# Patient Record
Sex: Male | Born: 1955 | Race: White | Hispanic: No | Marital: Married | Smoking: Never smoker
Health system: Southern US, Community
[De-identification: ages and names within clinical notes are randomized; demographics above are authoritative.]

## PROBLEM LIST (undated history)

## (undated) DIAGNOSIS — E785 Hyperlipidemia, unspecified: Secondary | ICD-10-CM

## (undated) DIAGNOSIS — J45909 Unspecified asthma, uncomplicated: Secondary | ICD-10-CM

## (undated) DIAGNOSIS — T7840XA Allergy, unspecified, initial encounter: Secondary | ICD-10-CM

## (undated) DIAGNOSIS — N400 Enlarged prostate without lower urinary tract symptoms: Secondary | ICD-10-CM

## (undated) DIAGNOSIS — G5600 Carpal tunnel syndrome, unspecified upper limb: Secondary | ICD-10-CM

## (undated) DIAGNOSIS — M1990 Unspecified osteoarthritis, unspecified site: Secondary | ICD-10-CM

## (undated) HISTORY — PX: EYE SURGERY: SHX253

## (undated) HISTORY — PX: CARPAL TUNNEL RELEASE: SHX101

## (undated) HISTORY — PX: TONSILLECTOMY: SUR1361

## (undated) HISTORY — PX: NASAL SINUS SURGERY: SHX719

## (undated) HISTORY — PX: COLONOSCOPY: SHX174

## (undated) HISTORY — PX: REFRACTIVE SURGERY: SHX103

---
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
## 2005-01-09 ENCOUNTER — Ambulatory Visit: Payer: Self-pay | Admitting: Internal Medicine

## 2005-01-13 ENCOUNTER — Ambulatory Visit: Payer: Self-pay | Admitting: Internal Medicine

## 2007-09-25 ENCOUNTER — Ambulatory Visit: Payer: Self-pay | Admitting: Emergency Medicine

## 2010-01-17 ENCOUNTER — Ambulatory Visit: Payer: Self-pay | Admitting: Unknown Physician Specialty

## 2012-02-01 ENCOUNTER — Ambulatory Visit: Payer: Self-pay | Admitting: Unknown Physician Specialty

## 2016-01-02 DIAGNOSIS — F325 Major depressive disorder, single episode, in full remission: Secondary | ICD-10-CM | POA: Insufficient documentation

## 2017-11-02 DIAGNOSIS — I10 Essential (primary) hypertension: Secondary | ICD-10-CM

## 2017-11-02 HISTORY — DX: Essential (primary) hypertension: I10

## 2019-10-13 ENCOUNTER — Other Ambulatory Visit: Payer: Self-pay

## 2019-10-13 DIAGNOSIS — Z20822 Contact with and (suspected) exposure to covid-19: Secondary | ICD-10-CM

## 2019-10-14 LAB — NOVEL CORONAVIRUS, NAA: SARS-CoV-2, NAA: NOT DETECTED

## 2020-08-05 DIAGNOSIS — N401 Enlarged prostate with lower urinary tract symptoms: Secondary | ICD-10-CM | POA: Insufficient documentation

## 2020-08-05 DIAGNOSIS — I10 Essential (primary) hypertension: Secondary | ICD-10-CM | POA: Insufficient documentation

## 2020-08-05 DIAGNOSIS — R351 Nocturia: Secondary | ICD-10-CM | POA: Insufficient documentation

## 2020-11-15 ENCOUNTER — Other Ambulatory Visit: Payer: Self-pay | Admitting: Infectious Diseases

## 2020-11-15 DIAGNOSIS — N451 Epididymitis: Secondary | ICD-10-CM

## 2020-11-18 ENCOUNTER — Other Ambulatory Visit: Payer: Self-pay

## 2020-11-18 ENCOUNTER — Ambulatory Visit
Admission: RE | Admit: 2020-11-18 | Discharge: 2020-11-18 | Disposition: A | Discharge status: Home / Self Care | Payer: Medicare PPO | Source: Ambulatory Visit | Attending: Infectious Diseases | Admitting: Infectious Diseases

## 2020-11-18 DIAGNOSIS — N451 Epididymitis: Secondary | ICD-10-CM | POA: Insufficient documentation

## 2020-11-20 ENCOUNTER — Encounter: Payer: Self-pay | Admitting: Urology

## 2020-11-20 ENCOUNTER — Other Ambulatory Visit: Payer: Self-pay

## 2020-11-20 ENCOUNTER — Ambulatory Visit: Payer: Medicare PPO | Admitting: Urology

## 2020-11-20 VITALS — BP 149/66 | HR 77 | Ht 67.0 in | Wt 239.4 lb

## 2020-11-20 DIAGNOSIS — M1990 Unspecified osteoarthritis, unspecified site: Secondary | ICD-10-CM | POA: Insufficient documentation

## 2020-11-20 DIAGNOSIS — N451 Epididymitis: Secondary | ICD-10-CM

## 2020-11-20 DIAGNOSIS — E785 Hyperlipidemia, unspecified: Secondary | ICD-10-CM | POA: Insufficient documentation

## 2020-11-20 DIAGNOSIS — N390 Urinary tract infection, site not specified: Secondary | ICD-10-CM

## 2020-11-20 LAB — URINALYSIS, COMPLETE
Bilirubin, UA: NEGATIVE
Glucose, UA: NEGATIVE
Leukocytes,UA: NEGATIVE
Nitrite, UA: NEGATIVE
RBC, UA: NEGATIVE
Specific Gravity, UA: >1.03 — ABNORMAL HIGH (ref 1.005–1.030)
Urobilinogen, Ur: 0.2 mg/dL (ref 0.2–1.0)
pH, UA: 5 (ref 5.0–7.5)

## 2020-11-20 LAB — MICROSCOPIC EXAMINATION
Bacteria, UA: NONE SEEN
RBC, Urine: NONE SEEN /hpf (ref 0–2)

## 2020-11-20 MED ORDER — TADALAFIL 5 MG PO TABS: 5.0000 mg | ORAL_TABLET | ORAL | 6 refills | Status: DC

## 2020-11-20 NOTE — Patient Instructions (Signed)
Epididymitis  Epididymitis is swelling (inflammation) or infection of the epididymis. The epididymis is a cord-like structure that is located along the top and back part of the testicle. It collects and stores sperm from the testicle. This condition can also cause pain and swelling of the testicle and scrotum. Symptoms usually start suddenly (acute epididymitis). Sometimes epididymitis starts gradually and lasts for a while (chronic epididymitis). This type may be harder to treat. What are the causes? In men ages 20-40, this condition is usually caused by a bacterial infection or a sexually transmitted disease (STD), such as:  Gonorrhea.  Chlamydia. In men 40 and older who do not have anal sex, this condition is usually caused by bacteria from a blockage or from abnormalities in the urinary system. These can result from:  Having a tube placed into the bladder (urinary catheter).  Having an enlarged or inflamed prostate gland.  Having recently had urinary tract surgery.  Having a problem with a backward flow of urine (retrograde). In men who have a condition that weakens the body's defense system (immune system), such as HIV, this condition can be caused by:  Other bacteria, including tuberculosis and syphilis.  Viruses.  Fungi. Sometimes this condition occurs without infection. This may happen because of trauma or repetitive activities such as sports. What increases the risk? You are more likely to develop this condition if you have:  Unprotected sex with more than one partner.  Anal sex.  Recently had surgery.  A urinary catheter.  Urinary problems.  A suppressed immune system. What are the signs or symptoms? This condition usually begins suddenly with chills, fever, and pain behind the scrotum and in the testicle. Other symptoms include:  Swelling of the scrotum, testicle, or both.  Pain when ejaculating or urinating.  Pain in the back or  abdomen.  Nausea.  Itching and discharge from the penis.  A frequent need to pass urine.  Redness, increased warmth, and tenderness of the scrotum. How is this diagnosed? Your health care provider can diagnose this condition based on your symptoms and medical history. Your health care provider will also do a physical exam to ask about your symptoms and check your scrotum and testicle for swelling, pain, and redness. You may also have other tests, including:  Examination of discharge from the penis.  Urine tests for infections, such as STDs.  Ultrasound test for blood flow and inflammation. Your health care provider may test you for other STDs, including HIV. How is this treated? Treatment for this condition depends on the cause. If your condition is caused by a bacterial infection, oral antibiotic medicine may be prescribed. If the bacterial infection has spread to your blood, you may need to receive IV antibiotics. For both bacterial and nonbacterial epididymitis, you may be treated with:  Rest.  Elevation of the scrotum.  Pain medicines.  Anti-inflammatory medicines. Surgery may be needed to treat:  Bacterial epididymitis that causes pus to build up in the scrotum (abscess).  Chronic epididymitis that has not responded to other treatments. Follow these instructions at home: Medicines  Take over-the-counter and prescription medicines only as told by your health care provider.  If you were prescribed an antibiotic medicine, take it as told by your health care provider. Do not stop taking the antibiotic even if your condition improves. Sexual activity  If your epididymitis was caused by an STD, avoid sexual activity until your treatment is complete.  Inform your sexual partner or partners if you test positive for   an STD. They may need to be treated. Do not engage in sexual activity with your partner or partners until their treatment is completed. Managing pain and  swelling  If directed, elevate your scrotum and apply ice. ? Put ice in a plastic bag. ? Place a small towel or pillow between your legs. ? Rest your scrotum on the pillow or towel. ? Place another towel between your skin and the plastic bag. ? Leave the ice on for 20 minutes, 2-3 times a day.  Try taking a sitz bath to help with discomfort. This is a warm water bath that is taken while you are sitting down. The water should only come up to your hips and should cover your buttocks. Do this 3-4 times per day or as told by your health care provider.  Keep your scrotum elevated and supported while resting. Ask your health care provider if you should wear a scrotal support, such as a jockstrap. Wear it as told by your health care provider.   General instructions  Return to your normal activities as told by your health care provider. Ask your health care provider what activities are safe for you.  Drink enough fluid to keep your urine pale yellow.  Keep all follow-up visits as told by your health care provider. This is important. Contact a health care provider if:  You have a fever.  Your pain medicine is not helping.  Your pain is getting worse.  Your symptoms do not improve within 3 days. Summary  Epididymitis is swelling (inflammation) or infection of the epididymis. This condition can also cause pain and swelling of the testicle and scrotum.  Treatment for this condition depends on the cause. If your condition is caused by a bacterial infection, oral antibiotic medicine may be prescribed.  Inform your sexual partner or partners if you test positive for an STD. They may need to be treated. Do not engage in sexual activity with your partner or partners until their treatment is completed.  Contact a health care provider if your symptoms do not improve within 3 days. This information is not intended to replace advice given to you by your health care provider. Make sure you discuss any  questions you have with your health care provider. Document Revised: 08/22/2018 Document Reviewed: 08/23/2018 Elsevier Patient Education  2021 Elsevier Inc.   Cystoscopy Cystoscopy is a procedure that is used to help diagnose and sometimes treat conditions that affect the lower urinary tract. The lower urinary tract includes the bladder and the urethra. The urethra is the tube that drains urine from the bladder. Cystoscopy is done using a thin, tube-shaped instrument with a light and camera at the end (cystoscope). The cystoscope may be hard or flexible, depending on the goal of the procedure. The cystoscope is inserted through the urethra, into the bladder. Cystoscopy may be recommended if you have:  Urinary tract infections that keep coming back.  Blood in the urine (hematuria).  An inability to control when you urinate (urinary incontinence) or an overactive bladder.  Unusual cells found in a urine sample.  A blockage in the urethra, such as a urinary stone.  Painful urination.  An abnormality in the bladder found during an intravenous pyelogram (IVP) or CT scan. Cystoscopy may also be done to remove a sample of tissue to be examined under a microscope (biopsy). Tell a health care provider about:  Any allergies you have.  All medicines you are taking, including vitamins, herbs, eye drops, creams, and  over-the-counter medicines.  Any problems you or family members have had with anesthetic medicines.  Any blood disorders you have.  Any surgeries you have had.  Any medical conditions you have.  Whether you are pregnant or may be pregnant. What are the risks? Generally, this is a safe procedure. However, problems may occur, including:  Infection.  Bleeding.  Allergic reactions to medicines.  Damage to other structures or organs. What happens before the procedure? Medicines Ask your health care provider about:  Changing or stopping your regular medicines. This is  especially important if you are taking diabetes medicines or blood thinners.  Taking medicines such as aspirin and ibuprofen. These medicines can thin your blood. Do not take these medicines unless your health care provider tells you to take them.  Taking over-the-counter medicines, vitamins, herbs, and supplements. Tests You may have an exam or testing, such as:  X-rays of the bladder, urethra, or kidneys.  CT scan of the abdomen or pelvis.  Urine tests to check for signs of infection. General instructions  Follow instructions from your health care provider about eating or drinking restrictions.  Ask your health care provider what steps will be taken to help prevent infection. These steps may include: ? Washing skin with a germ-killing soap. ? Taking antibiotic medicine.  Plan to have a responsible adult take you home from the hospital or clinic. What happens during the procedure?  You will be given one or more of the following: ? A medicine to help you relax (sedative). ? A medicine to numb the area (local anesthetic).  The area around the opening of your urethra will be cleaned.  The cystoscope will be passed through your urethra into your bladder.  Germ-free (sterile) fluid will flow through the cystoscope to fill your bladder. The fluid will stretch your bladder so that your health care provider can clearly examine your bladder walls.  Your doctor will look at the urethra and bladder. Your doctor may take a biopsy or remove stones.  The cystoscope will be removed, and your bladder will be emptied. The procedure may vary among health care providers and hospitals.   What can I expect after the procedure? After the procedure, it is common to have:  Some soreness or pain in your abdomen and urethra.  Urinary symptoms. These include: ? Mild pain or burning when you urinate. Pain should stop within a few minutes after you urinate. This may last for up to 1 week. ? A small  amount of blood in your urine for several days. ? Feeling like you need to urinate but producing only a small amount of urine. Follow these instructions at home: Medicines  Take over-the-counter and prescription medicines only as told by your health care provider.  If you were prescribed an antibiotic medicine, take it as told by your health care provider. Do not stop taking the antibiotic even if you start to feel better. General instructions  Return to your normal activities as told by your health care provider. Ask your health care provider what activities are safe for you.  If you were given a sedative during the procedure, it can affect you for several hours. Do not drive or operate machinery until your health care provider says that it is safe.  Watch for any blood in your urine. If the amount of blood in your urine increases, call your health care provider.    Follow instructions from your health care provider about eating or drinking restrictions.  If  a tissue sample was removed for testing (biopsy) during your procedure, it is up to you to get your test results. Ask your health care provider, or the department that is doing the test, when your results will be ready.  Drink enough fluid to keep your urine pale yellow.  Keep all follow-up visits. This is important. Contact a health care provider if:  You have pain that gets worse or does not get better with medicine, especially pain when you urinate.  You have trouble urinating.  You have more blood in your urine. Get help right away if:  You have blood clots in your urine.  You have abdominal pain.  You have a fever or chills.  You are unable to urinate. Summary  Cystoscopy is a procedure that is used to help diagnose and sometimes treat conditions that affect the lower urinary tract.  Cystoscopy is done using a thin, tube-shaped instrument with a light and camera at the end.  After the procedure, it is common  to have some soreness or pain in your abdomen and urethra.  Watch for any blood in your urine. If the amount of blood in your urine increases, call your health care provider.  If you were prescribed an antibiotic medicine, take it as told by your health care provider. Do not stop taking the antibiotic even if you start to feel better. This information is not intended to replace advice given to you by your health care provider. Make sure you discuss any questions you have with your health care provider. Document Revised: 05/31/2020 Document Reviewed: 05/31/2020 Elsevier Patient Education  2021 Elsevier Inc.  Tadalafil tablets (Cialis) What is this medicine? TADALAFIL (tah DA la fil) is used to treat erection problems in men. It is also used for enlargement of the prostate gland in men, a condition called benign prostatic hyperplasia or BPH. This medicine improves urine flow and reduces BPH symptoms. This medicine can also treat both erection problems and BPH when they occur together. This medicine may be used for other purposes; ask your health care provider or pharmacist if you have questions. COMMON BRAND NAME(S): Mady GemmaAdcirca, ALYQ, Cialis What should I tell my health care provider before I take this medicine? They need to know if you have any of these conditions:  bleeding disorders  eye or vision problems, including a rare inherited eye disease called retinitis pigmentosa  anatomical deformation of the penis, Peyronie's disease, or history of priapism (painful and prolonged erection)  heart disease, angina, a history of heart attack, irregular heart beats, or other heart problems  high or low blood pressure  history of blood diseases, like sickle cell anemia or leukemia  history of stomach bleeding  kidney disease  liver disease  stroke  an unusual or allergic reaction to tadalafil, other medicines, foods, dyes, or preservatives  pregnant or trying to get  pregnant  breast-feeding How should I use this medicine? Take this medicine by mouth with a glass of water. Follow the directions on the prescription label. You may take this medicine with or without meals. When this medicine is used for erection problems, your doctor may prescribe it to be taken once daily or as needed. If you are taking the medicine as needed, you may be able to have sexual activity 30 minutes after taking it and for up to 36 hours after taking it. Whether you are taking the medicine as needed or once daily, you should not take more than one dose per day. If  you are taking this medicine for symptoms of benign prostatic hyperplasia (BPH) or to treat both BPH and an erection problem, take the dose once daily at about the same time each day. Do not take your medicine more often than directed. Talk to your pediatrician regarding the use of this medicine in children. Special care may be needed. Overdosage: If you think you have taken too much of this medicine contact a poison control center or emergency room at once. NOTE: This medicine is only for you. Do not share this medicine with others. What if I miss a dose? If you are taking this medicine as needed for erection problems, this does not apply. If you miss a dose while taking this medicine once daily for an erection problem, benign prostatic hyperplasia, or both, take it as soon as you remember, but do not take more than one dose per day. What may interact with this medicine? Do not take this medicine with any of the following medications:  nitrates like amyl nitrite, isosorbide dinitrate, isosorbide mononitrate, nitroglycerin  other medicines for erectile dysfunction like avanafil, sildenafil, vardenafil  other tadalafil products (Adcirca)  riociguat This medicine may also interact with the following medications:  certain drugs for high blood pressure  certain drugs for the treatment of HIV infection or AIDS  certain  drugs used for fungal or yeast infections, like fluconazole, itraconazole, ketoconazole, and voriconazole  certain drugs used for seizures like carbamazepine, phenytoin, and phenobarbital  grapefruit juice  macrolide antibiotics like clarithromycin, erythromycin, troleandomycin  medicines for prostate problems  rifabutin, rifampin or rifapentine This list may not describe all possible interactions. Give your health care provider a list of all the medicines, herbs, non-prescription drugs, or dietary supplements you use. Also tell them if you smoke, drink alcohol, or use illegal drugs. Some items may interact with your medicine. What should I watch for while using this medicine? If you notice any changes in your vision while taking this drug, call your doctor or health care professional as soon as possible. Stop using this medicine and call your health care provider right away if you have a loss of sight in one or both eyes. Contact your doctor or health care professional right away if the erection lasts longer than 4 hours or if it becomes painful. This may be a sign of serious problem and must be treated right away to prevent permanent damage. If you experience symptoms of nausea, dizziness, chest pain or arm pain upon initiation of sexual activity after taking this medicine, you should refrain from further activity and call your doctor or health care professional as soon as possible. Do not drink alcohol to excess (examples, 5 glasses of wine or 5 shots of whiskey) when taking this medicine. When taken in excess, alcohol can increase your chances of getting a headache or getting dizzy, increasing your heart rate or lowering your blood pressure. Using this medicine does not protect you or your partner against HIV infection (the virus that causes AIDS) or other sexually transmitted diseases. What side effects may I notice from receiving this medicine? Side effects that you should report to your  doctor or health care professional as soon as possible:  allergic reactions like skin rash, itching or hives, swelling of the face, lips, or tongue  breathing problems  changes in hearing  changes in vision  chest pain  fast, irregular heartbeat  prolonged or painful erection  seizures Side effects that usually do not require medical attention (report to  your doctor or health care professional if they continue or are bothersome):  back pain  dizziness  flushing  headache  indigestion  muscle aches  nausea  stuffy or runny nose This list may not describe all possible side effects. Call your doctor for medical advice about side effects. You may report side effects to FDA at 1-800-FDA-1088. Where should I keep my medicine? Keep out of the reach of children. Store at room temperature between 15 and 30 degrees C (59 and 86 degrees F). Throw away any unused medicine after the expiration date. NOTE: This sheet is a summary. It may not cover all possible information. If you have questions about this medicine, talk to your doctor, pharmacist, or health care provider.  2021 Elsevier/Gold Standard (2014-03-09 13:15:49)

## 2020-11-20 NOTE — Progress Notes (Signed)
   11/20/20 8:50 AM   Philipp Ovens [birth year] [id number]  CC: Epididymitis, history of prostatitis, ED  HPI: I saw Mr. Burdell for evaluation of recurrent UTIs and epididymitis.  He is a 65 year old healthy male who had documented E. coli prostatitis in May and July 2021, and most recently right scrotal swelling and pain consistent with epididymitis.  Recent scrotal ultrasound was essentially benign, and his symptoms have improved.  He is on Cipro that was prescribed by his PCP, as well as Flomax.  He denies any significant urinary symptoms at baseline aside from occasional urgency.  He denies any gross hematuria.  He is a never smoker.  He has had some trouble with erections that is new since his most recent infection, and is never tried any medications for this.  He denies any family history of prostate cancer, but has a strong family history of BPH and "prostate problems."  He is married and denies any other sexual partners.  STD testing was reportedly normal with his PCP.  He reports his right-sided scrotal swelling has almost completely resolved.  PSA was normal at 0.89 in March 2021.  Family History: Family History  Problem Relation Age of Onset  . CAD Father   . Cancer Father     Social History:  does not have a smoking history on file. He has never used smokeless tobacco. He reports that he does not use drugs. No history on file for alcohol use.  Physical Exam: BP (!) 149/66 (BP Location: Left Arm, Patient Position: Sitting, Cuff Size: Large)   Pulse 77   Ht 5\' 7"  (1.702 m)   Wt 239 lb 6.4 oz (108.6 kg)   BMI 37.50 kg/m    Constitutional:  Alert and oriented, No acute distress. Cardiovascular: No clubbing, cyanosis, or edema. Respiratory: Normal respiratory effort, no increased work of breathing. GI: Abdomen is soft, nontender, nondistended, no abdominal masses GU: Circumcised phallus with patent meatus, testicles 20 cc and descended bilaterally without masses, right  testicle tender and mildly edematous consistent with resolving epididymitis  Laboratory Data: Reviewed, see HPI  Pertinent Imaging: I have personally viewed and interpreted the scrotal ultrasound showing an enlarged right epididymis most consistent with resolving epididymitis, consider repeat in 3 months.  Assessment & Plan:   65 year old male with no reported urinary symptoms and recurrent infections with culture documented E. coli prostatitis in May and June 2021, and most recently right-sided epididymitis with negative culture.  We discussed possible etiologies at length.  I recommended further evaluation with CT urogram and cystoscopy.  We also reviewed his new problems with erectile dysfunction, I recommended a trial of Cialis 5 mg every other day as his epididymitis is resolving.  Follow-up for CT urogram and cystoscopy, PVR, consider repeat scrotal ultrasound in 3 months Trial of Cialis 5 mg every other day for ED   02-14-2001, MD 11/20/2020  [facility] [address] [place], [place] [phone]

## 2020-11-22 ENCOUNTER — Other Ambulatory Visit: Payer: Self-pay

## 2020-12-04 ENCOUNTER — Other Ambulatory Visit: Payer: Self-pay

## 2020-12-04 ENCOUNTER — Ambulatory Visit
Admission: RE | Admit: 2020-12-04 | Discharge: 2020-12-04 | Disposition: A | Discharge status: Home / Self Care | Payer: Medicare PPO | Source: Ambulatory Visit | Attending: Urology | Admitting: Urology

## 2020-12-04 DIAGNOSIS — K5730 Diverticulosis of large intestine without perforation or abscess without bleeding: Secondary | ICD-10-CM | POA: Diagnosis not present

## 2020-12-04 DIAGNOSIS — M47816 Spondylosis without myelopathy or radiculopathy, lumbar region: Secondary | ICD-10-CM | POA: Diagnosis not present

## 2020-12-04 DIAGNOSIS — N390 Urinary tract infection, site not specified: Secondary | ICD-10-CM

## 2020-12-04 DIAGNOSIS — K449 Diaphragmatic hernia without obstruction or gangrene: Secondary | ICD-10-CM | POA: Insufficient documentation

## 2020-12-04 DIAGNOSIS — N400 Enlarged prostate without lower urinary tract symptoms: Secondary | ICD-10-CM | POA: Insufficient documentation

## 2020-12-04 LAB — POCT I-STAT CREATININE: Creatinine, Ser: 1.1 mg/dL (ref 0.61–1.24)

## 2020-12-04 MED ORDER — IOHEXOL 300 MG/ML  SOLN
125.0000 mL | Freq: Once | INTRAMUSCULAR | Status: AC | PRN
  Administered 2020-12-04: 125 mL via INTRAVENOUS

## 2020-12-11 ENCOUNTER — Encounter: Payer: Self-pay | Admitting: Urology

## 2020-12-11 ENCOUNTER — Other Ambulatory Visit: Payer: Self-pay

## 2020-12-11 ENCOUNTER — Ambulatory Visit (INDEPENDENT_AMBULATORY_CARE_PROVIDER_SITE_OTHER): Payer: Medicare PPO | Admitting: Urology

## 2020-12-11 VITALS — BP 142/85 | HR 69 | Ht 67.0 in | Wt 237.0 lb

## 2020-12-11 DIAGNOSIS — N390 Urinary tract infection, site not specified: Secondary | ICD-10-CM | POA: Diagnosis not present

## 2020-12-11 DIAGNOSIS — N451 Epididymitis: Secondary | ICD-10-CM

## 2020-12-11 LAB — URINALYSIS, COMPLETE
Bilirubin, UA: NEGATIVE
Glucose, UA: NEGATIVE
Ketones, UA: NEGATIVE
Leukocytes,UA: NEGATIVE
Nitrite, UA: NEGATIVE
Protein,UA: NEGATIVE
Specific Gravity, UA: >1.03 — ABNORMAL HIGH (ref 1.005–1.030)
Urobilinogen, Ur: 0.2 mg/dL (ref 0.2–1.0)
pH, UA: 5 (ref 5.0–7.5)

## 2020-12-11 LAB — MICROSCOPIC EXAMINATION: Bacteria, UA: NONE SEEN

## 2020-12-11 LAB — BLADDER SCAN AMB NON-IMAGING: Scan Result: 3

## 2020-12-11 NOTE — Patient Instructions (Signed)

## 2020-12-11 NOTE — Progress Notes (Signed)
Cystoscopy Procedure Note:  Indication: Recurrent UTIs  After informed consent and discussion of the procedure and its risks, Donald Kennedy was positioned and prepped in the standard fashion. Cystoscopy was performed with a flexible cystoscope. The urethra, bladder neck and entire bladder was visualized in a standard fashion. The prostate was moderate in size with a high bladder neck and obstructing lateral lobes, no median lobe. The ureteral orifices were visualized in their normal location and orientation.  Bladder mucosa grossly normal throughout, no suspicious lesions.  Imaging: I personally reviewed the CT urogram showing a intraparenchymal left lower pole stone, no hydronephrosis.  Prostate measures 47 g.  Findings: Moderate size prostate with obstructing lateral lobes, no median lobe -----------------------------------------------------------------------  Assessment and Plan: 65 year old male with recurrent episodes of prostatitis and epididymitis, resolved now after a 4-week course of antibiotics.  Urinary symptoms currently well controlled on Flomax and IPSS score today is 8, with quality of life mixed, and PVR is normal at 3 mL.  Urinalysis today is benign.  We reviewed options in the future if he has recurrent infections or worsening urinary symptoms including UroLift.  This is a 10-minute procedure that is done under anesthesia and can help open the prostatic channel for stronger voiding, and decrease risk of infections, and ability to discontinue Flomax.  He is doing well at this time, and I think it is very reasonable to continue Flomax for the time being.  Regarding his epididymitis, radiology recommended a follow-up scrotal ultrasound in 3 months to evaluate for resolution of heterogenous epididymis, and this was ordered.  In terms of erectile dysfunction, he is still having problems despite the 5 mg Cialis every other day.  We reviewed that the max dose is 20 mg, and I  recommended he titrate as needed, starting by 10 mg either on demand or every other day.  RTC 3 months discussed ED, IPSS, PVR, follow-up scrotal ultrasound  Legrand Rams, MD 12/11/2020

## 2021-02-18 ENCOUNTER — Ambulatory Visit
Admission: RE | Admit: 2021-02-18 | Discharge: 2021-02-18 | Disposition: A | Discharge status: Home / Self Care | Payer: Medicare PPO | Source: Ambulatory Visit | Attending: Urology | Admitting: Urology

## 2021-02-18 ENCOUNTER — Other Ambulatory Visit: Payer: Self-pay

## 2021-02-18 DIAGNOSIS — N451 Epididymitis: Secondary | ICD-10-CM | POA: Insufficient documentation

## 2021-03-12 ENCOUNTER — Encounter: Payer: Self-pay | Admitting: Urology

## 2021-03-12 ENCOUNTER — Ambulatory Visit: Payer: Medicare PPO | Admitting: Urology

## 2021-03-12 ENCOUNTER — Other Ambulatory Visit: Payer: Self-pay

## 2021-03-12 VITALS — BP 140/70 | HR 81 | Ht 67.0 in | Wt 245.0 lb

## 2021-03-12 DIAGNOSIS — N390 Urinary tract infection, site not specified: Secondary | ICD-10-CM

## 2021-03-12 DIAGNOSIS — N138 Other obstructive and reflux uropathy: Secondary | ICD-10-CM

## 2021-03-12 DIAGNOSIS — N529 Male erectile dysfunction, unspecified: Secondary | ICD-10-CM

## 2021-03-12 DIAGNOSIS — N401 Enlarged prostate with lower urinary tract symptoms: Secondary | ICD-10-CM | POA: Diagnosis not present

## 2021-03-12 LAB — BLADDER SCAN AMB NON-IMAGING

## 2021-03-12 MED ORDER — TADALAFIL 10 MG PO TABS: 10.0000 mg | ORAL_TABLET | Freq: Every day | ORAL | 6 refills | Status: DC | PRN

## 2021-03-12 NOTE — Progress Notes (Signed)
   03/12/2021 4:20 PM   Donald Kennedy 03-09-1956 [id number]  Reason for visit: Follow up recurrent UTIs, BPH, ED, abnormal scrotal ultrasound  HPI: 65 year old male who had recurrent E. coli prostatitis in 2021, and most recently right-sided epididymitis.  He underwent further evaluation with a CT urogram that was benign and showed a 47 g prostate, and cystoscopy showed a moderate size prostate with some obstructing lateral lobes of the bladder was grossly normal.  He opted to continue Flomax.  PVRs have been normal.  PVR is normal again today at 10 mL.  We have previously discussed UroLift, but he wanted to hold off at this time.  Regarding his abnormal scrotal ultrasound results, there is some heterogeneity of the right epididymis, and radiology recommended a follow-up scrotal ultrasound.  I personally viewed and interpreted the scrotal ultrasound dated 02/18/2021 that shows stable appearance of the right epididymis that likely represents a degree of tubular ectasia.  Suspect this is a benign finding.  Finally, in terms of his ED, he is getting good results with Cialis 10 mg on demand.  Risks and benefits discussed again.  Refilled.  RTC 1 year for IPSS and PVR  Sondra Come, MD  [facility] [address] [place], [place] [address] [phone]

## 2021-07-22 ENCOUNTER — Other Ambulatory Visit: Payer: Self-pay | Admitting: *Deleted

## 2021-07-22 MED ORDER — TAMSULOSIN HCL 0.4 MG PO CAPS: 0.4000 mg | ORAL_CAPSULE | Freq: Every day | ORAL | 3 refills | Status: DC

## 2021-10-19 DIAGNOSIS — M1712 Unilateral primary osteoarthritis, left knee: Secondary | ICD-10-CM | POA: Insufficient documentation

## 2021-10-19 DIAGNOSIS — M1711 Unilateral primary osteoarthritis, right knee: Secondary | ICD-10-CM | POA: Insufficient documentation

## 2021-10-20 ENCOUNTER — Other Ambulatory Visit: Payer: Self-pay | Admitting: Orthopedic Surgery

## 2021-10-20 DIAGNOSIS — M2391 Unspecified internal derangement of right knee: Secondary | ICD-10-CM

## 2021-10-23 ENCOUNTER — Ambulatory Visit
Admission: RE | Admit: 2021-10-23 | Discharge: 2021-10-23 | Disposition: A | Discharge status: Home / Self Care | Payer: Medicare PPO | Source: Ambulatory Visit | Attending: Orthopedic Surgery | Admitting: Orthopedic Surgery

## 2021-10-23 ENCOUNTER — Other Ambulatory Visit: Payer: Self-pay

## 2021-10-23 DIAGNOSIS — M2391 Unspecified internal derangement of right knee: Secondary | ICD-10-CM

## 2021-11-18 NOTE — Discharge Instructions (Addendum)
Instructions after Knee Arthroscopy    James P. Hooten, Jr., M.D.     Dept. of Orthopaedics & Sports Medicine  [facility]  [address]  [place], [place]  [address]   Phone: [phone]   Fax: [phone]   DIET: Drink plenty of non-alcoholic fluids & begin a light diet. Resume your normal diet the day after surgery.  ACTIVITY:  You may use crutches or a walker with weight-bearing as tolerated, unless instructed otherwise. You may wean yourself off of the walker or crutches as tolerated.  Begin doing gentle exercises. Exercising will reduce the pain and swelling, increase motion, and prevent muscle weakness.   Avoid strenuous activities or athletics for a minimum of 4-6 weeks after arthroscopic surgery. Do not drive or operate any equipment until instructed.  WOUND CARE:  Place one to two pillows under the knee the first day or two when sitting or lying.  Continue to use the ice packs periodically to reduce pain and swelling. The small incisions in your knee are closed with nylon stitches. The stitches will be removed in the office. The bulky dressing may be removed on the second day after surgery. DO NOT TOUCH THE STITCHES. Put a Band-Aid over each stitch. Do NOT use any ointments or creams on the incisions.  You may bathe or shower after the stitches are removed at the first office visit following surgery.  MEDICATIONS: You may resume your regular medications. Please take the pain medication as prescribed. Do not take pain medication on an empty stomach. Do not drive or drink alcoholic beverages when taking pain medications.  CALL THE OFFICE FOR: Temperature above 101 degrees Excessive bleeding or drainage on the dressing. Excessive swelling, coldness, or paleness of the toes. Persistent nausea and vomiting.  FOLLOW-UP:  You should have an appointment to return to the office in 7-10 days after surgery.      [facility] Department Directory          [email or web address]       [email or web address]          Cardiology  Appointments: [place] - [phone] [place] - [phone]  Endocrinology  Appointments: [place] - [phone] [place] - [phone]  Gastroenterology  Appointments: [place] - [phone] [place] - [phone]        General Surgery   Appointments: [place] - [phone]  Internal Medicine/Family Medicine  Appointments: [place] - [phone] [place] - [phone] [place] - [phone]  Metabolic and Weigh Loss Surgery  Appointments: [place] - [phone]        Neurology  Appointments: [place] - [phone] [place] - [phone]  Neurosurgery  Appointments: [place] - [phone]  Obstetrics & Gynecology  Appointments: [place] - [phone] [place] - [phone]        Pediatrics  Appointments: [place] - [phone] [place] - [phone]  Physiatry  Appointments: [place] -[phone]  Physical Therapy  Appointments: [place] - [phone] [place] - [phone]        Podiatry  Appointments: [place] - [phone] [place] - [phone]  Pulmonology  Appointments: [place] - [phone]  Rheumatology  Appointments: [place] - [phone]        [place] Location: [facility]  [address] [place], [place]  [address]  [place] Location: [facility] [address] [place], [place]  [address]  [place] Location: [facility] [address] [place], [place]  [address]   AMBULATORY SURGERY  DISCHARGE INSTRUCTIONS   The drugs that you were given will stay in your system until tomorrow so for the next 24 hours you should   not:  Drive an automobile Make any legal decisions Drink any alcoholic beverage   You may resume regular meals tomorrow.  Today it is better to start with liquids and gradually work up to solid foods.  You may eat anything you prefer, but it is better to start  with liquids, then soup and crackers, and gradually work up to solid foods.   Please notify your doctor immediately if you have any unusual bleeding, trouble breathing, redness and pain at the surgery site, drainage, fever, or pain not relieved by medication.    Your post-operative visit with Dr.                                       is: Date:                        Time:    Please call to schedule your post-operative visit.  Additional Instructions: 

## 2021-11-19 ENCOUNTER — Encounter
Admission: RE | Admit: 2021-11-19 | Discharge: 2021-11-19 | Disposition: A | Discharge status: Home / Self Care | Payer: Medicare PPO | Source: Ambulatory Visit | Attending: Orthopedic Surgery | Admitting: Orthopedic Surgery

## 2021-11-19 ENCOUNTER — Encounter: Payer: Self-pay | Admitting: Urgent Care

## 2021-11-19 ENCOUNTER — Other Ambulatory Visit: Payer: Self-pay

## 2021-11-19 VITALS — Ht 66.5 in | Wt 235.0 lb

## 2021-11-19 DIAGNOSIS — I10 Essential (primary) hypertension: Secondary | ICD-10-CM | POA: Diagnosis not present

## 2021-11-19 DIAGNOSIS — Z01818 Encounter for other preprocedural examination: Secondary | ICD-10-CM | POA: Diagnosis present

## 2021-11-19 DIAGNOSIS — Z01812 Encounter for preprocedural laboratory examination: Secondary | ICD-10-CM

## 2021-11-19 HISTORY — DX: Unspecified osteoarthritis, unspecified site: M19.90

## 2021-11-19 HISTORY — DX: Hyperlipidemia, unspecified: E78.5

## 2021-11-19 HISTORY — DX: Unspecified asthma, uncomplicated: J45.909

## 2021-11-19 HISTORY — DX: Benign prostatic hyperplasia without lower urinary tract symptoms: N40.0

## 2021-11-19 HISTORY — DX: Carpal tunnel syndrome, unspecified upper limb: G56.00

## 2021-11-19 LAB — BASIC METABOLIC PANEL
Anion gap: 3 — ABNORMAL LOW (ref 5–15)
BUN: 17 mg/dL (ref 8–23)
CO2: 27 mmol/L (ref 22–32)
Calcium: 9 mg/dL (ref 8.9–10.3)
Chloride: 106 mmol/L (ref 98–111)
Creatinine, Ser: 0.94 mg/dL (ref 0.61–1.24)
GFR, Estimated: >60 mL/min (ref >60)
Glucose, Bld: 90 mg/dL (ref 70–99)
Potassium: 4.1 mmol/L (ref 3.5–5.1)
Sodium: 136 mmol/L (ref 135–145)

## 2021-11-19 LAB — CBC
HCT: 42.5 % (ref 39.0–52.0)
Hemoglobin: 14.6 g/dL (ref 13.0–17.0)
MCH: 30.7 pg (ref 26.0–34.0)
MCHC: 34.4 g/dL (ref 30.0–36.0)
MCV: 89.5 fL (ref 80.0–100.0)
Platelets: 283 10*3/uL (ref 150–400)
RBC: 4.75 MIL/uL (ref 4.22–5.81)
RDW: 12.4 % (ref 11.5–15.5)
WBC: 5.2 10*3/uL (ref 4.0–10.5)
nRBC: 0 % (ref 0.0–0.2)

## 2021-11-19 NOTE — Patient Instructions (Addendum)
Your procedure is scheduled on: Wednesday, January 25 Report to the Registration Desk on the 1st floor of the [facility]. To find out your arrival time, please call [phone] between 1PM - 3PM on: Tuesday, January 24  REMEMBER: Instructions that are not followed completely may result in serious medical risk, up to and including death; or upon the discretion of your surgeon and anesthesiologist your surgery may need to be rescheduled.  Do not eat food after midnight the night before surgery.  No gum chewing, lozengers or hard candies.  You may however, drink CLEAR liquids up to 2 hours before you are scheduled to arrive for your surgery. Do not drink anything within 2 hours of your scheduled arrival time.  Clear liquids include: - water  - apple juice without pulp - gatorade (not RED, PURPLE, OR BLUE) - black coffee or tea (Do NOT add milk or creamers to the coffee or tea) Do NOT drink anything that is not on this list.  In addition, your doctor has ordered for you to drink the provided  Ensure Pre-Surgery Clear Carbohydrate Drink  Drinking this carbohydrate drink up to two hours before surgery helps to reduce insulin resistance and improve patient outcomes. Please complete drinking 2 hours prior to scheduled arrival time.  DO NOT TAKE ANY MEDICATIONS THE MORNING OF SURGERY  One week prior to surgery: starting January 18 Stop Anti-inflammatories (NSAIDS) such as Advil, Aleve, Ibuprofen, Motrin, Naproxen, Naprosyn and Aspirin based products such as Excedrin, Goodys Powder, BC Powder. Stop ANY OVER THE COUNTER supplements until after surgery. STOP Vitamin C,D,zinc You may however, continue to take Tylenol if needed for pain up until the day of surgery.  No Alcohol for 24 hours before or after surgery.  No Smoking including e-cigarettes for 24 hours prior to surgery.  No chewable tobacco products for at least 6 hours prior to surgery.  No nicotine patches on the day of  surgery.  Do not use any "recreational" drugs for at least a week prior to your surgery.  Please be advised that the combination of cocaine and anesthesia may have negative outcomes, up to and including death. If you test positive for cocaine, your surgery will be cancelled.  On the morning of surgery brush your teeth with toothpaste and water, you may rinse your mouth with mouthwash if you wish. Do not swallow any toothpaste or mouthwash.  Use CHG Soap as directed on instruction sheet.  Do not wear jewelry, make-up, hairpins, clips or nail polish.  Do not wear lotions, powders, or perfumes.   Do not shave body from the neck down 48 hours prior to surgery just in case you cut yourself which could leave a site for infection.  Also, freshly shaved skin may become irritated if using the CHG soap.  Contact lenses, hearing aids and dentures may not be worn into surgery.  Do not bring valuables to the hospital. [facility] is not responsible for any missing/lost belongings or valuables.   Notify your doctor if there is any change in your medical condition (cold, fever, infection).  Wear comfortable clothing (specific to your surgery type) to the hospital.  After surgery, you can help prevent lung complications by doing breathing exercises.  Take deep breaths and cough every 1-2 hours. Your doctor may order a device called an Incentive Spirometer to help you take deep breaths.  If you are being discharged the day of surgery, you will not be allowed to drive home. You will need a  responsible adult (18 years or older) to drive you home and stay with you that night.   If you are taking public transportation, you will need to have a responsible adult (18 years or older) with you. Please confirm with your physician that it is acceptable to use public transportation.   Please call the [place] Dept. at [phone] if you have any questions about these instructions.  Surgery  Visitation Policy:  Patients undergoing a surgery or procedure may have one family member or support person with them as long as that person is not COVID-19 positive or experiencing its symptoms.  That person may remain in the waiting area during the procedure and may rotate out with other people.

## 2021-11-23 ENCOUNTER — Encounter: Payer: Self-pay | Admitting: Orthopedic Surgery

## 2021-11-23 NOTE — H&P (Signed)
ORTHOPAEDIC HISTORY & PHYSICAL Danelia Snodgrass, Adelina MingsJames Philmon Jr., MD - 10/30/2021 3:45 PM EST Formatting of this note is different from the original. Images from the original note were not included. Chief Complaint: Chief Complaint  Patient presents with   Right Knee - Follow-up   Reason for Visit: The patient is a 66 y.o. male who presents today for reevaluation of his right knee. He has a long history of right knee pain. He localizes most of the pain along the medial aspect of the knee.He reports some swelling and giving way of the right knee. The right knee pain is aggravated by pivoting, twisting, lateral movements, and squatting. He also reports increased right knee pain with walking on uneven ground. The patient has not appreciated any significant improvement despite activity modification, intraarticular corticosteroid injections, and viscosupplementation.   Medications: Current Outpatient Medications  Medication Sig Dispense Refill   cetirizine (ZYRTEC) 10 MG tablet Take 10 mg by mouth once daily.   clobetasoL (TEMOVATE) 0.05 % cream Apply topically 2 (two) times daily 60 g 0   simvastatin (ZOCOR) 40 MG tablet Take 1 tablet (40 mg total) by mouth at bedtime for 180 days 90 tablet 1   tadalafiL (CIALIS) 5 MG tablet Take 1 tablet by mouth every other day   vit A/vit D3/vit E/vit K1/zinc (ADEK GUMMIES PLUS ZINC ORAL) Take 2 Gum by mouth once daily   tamsulosin (FLOMAX) 0.4 mg capsule Take 1 capsule (0.4 mg total) by mouth once daily Take 30 minutes after same meal each day. 90 capsule 3   No current facility-administered medications for this visit.   Allergies: No Known Allergies  Past Medical History: Past Medical History:  Diagnosis Date   Allergy 1962   Asthma, unspecified asthma severity, unspecified whether complicated, unspecified whether persistent When younger, just some whizzing now...   Carpal tunnel syndrome   Cervicalgia   Depression But much better last few years    Hyperlipidemia   Hypertension   OA (osteoarthritis)   Past Surgical History: Past Surgical History:  Procedure Laterality Date   COLONOSCOPY 02/01/2012  Adenomatous Polyps: CBF 02/2015; recall ltr mailed 12/06/2014 (dw)   COLONOSCOPY 06/27/2015  Adenomatous Polyp: CBF 06/2020   COLONOSCOPY 08/04/2021  INTERNAL HEMORRHOIDS, DIVERTICULOSIS, CBC 2032/TKT   ENDOSCOPIC CARPAL TUNNEL RELEASE Right   TONSILLECTOMY   Social History: Social History   Socioeconomic History   Marital status: Married  Spouse name: Tonya   Number of children: 1   Years of education: 14   Highest education level: Associate degree: occupational, Scientist, product/process developmenttechnical, or vocational program  Occupational History   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[employer], Blood ImmunologistHound Handler  Tobacco Use   Smoking status: Never   Smokeless tobacco: Never  Vaping Use   Vaping Use: Never used  Substance and Sexual Activity   Alcohol use: No  Alcohol/week: 0.0 standard drinks   Drug use: Never   Sexual activity: Defer  Partners: Female   Family History: Family History  Problem Relation Age of Onset   Stroke Mother   High blood pressure (Hypertension) Mother   Throat cancer Father   Coronary Artery Disease (Blocked arteries around heart) Father   Allergies Other  Sibling   Review of Systems: A comprehensive 14 point ROS was performed, reviewed, and the pertinent orthopaedic findings are documented in the HPI.  Exam BP 130/88 (BP Location: Left upper arm, Patient Position: Sitting, BP Cuff Size: Adult)   Ht 170.2 cm (5\' 7" )   Wt (!) 108.1 kg (238 lb 6.4  oz)   BMI 37.34 kg/m   General:  Well-developed, well-nourished male seen in no acute distress.  Antalgic gait.  No varus or valgus thrust to the right knee.  HEENT:  Atraumatic, normocephalic. Pupils are equal and reactive to light. Extraocular motion is intact. Sclera are clear. Oropharynx is clear with moist mucosa.  Lungs:  Clear to auscultation  bilaterally.  Cardiovascular: Regular rate and rhythm. Normal S1, S2. No murmur . No appreciable gallops or rubs. Peripheral pulses are palpable. No lower extremity edema. Homan`s test is negative.   Extremities: Good strength, stability, and range of motion of the upper extremities. Good range of motion of the hips and ankles.  Right Knee:  Soft tissue swelling: minimal Effusion: none Erythema: none Crepitance: none Tenderness: medial joint line Alignment: normal Mediolateral laxity: stable Anterior drawer test:negative Lachman`s test: negative McMurray`s test: positive Atrophy: No significant atrophy.  Quadriceps tone was fair to good. Range of Motion: greater than 120 degrees   Neurologic:  Awake, alert, and oriented.  Sensory function is intact to pinprick and light touch.  Motor strength is judged to be 5/5.  Motor coordination is within normal limits.  No apparent clonus. No tremor.   MRI: I reviewed the right knee MRI from [facility] dated 10/23/2021. I concur with the radiologist's interpretation as below:  MRI OF THE RIGHT KNEE WITHOUT CONTRAST   TECHNIQUE:  Multiplanar, multisequence MR imaging of the knee was performed. No  intravenous contrast was administered.   COMPARISON:  None.   FINDINGS:  MENISCI   Medial meniscus: Complex tearing/maceration of the medial meniscal  body and posterior horn.   Lateral meniscus: Mild intrasubstance degeneration and slight free  edge fraying. Borderline discoid morphology.   LIGAMENTS   Cruciates: Intact ACL and PCL.   Collaterals: Intact MCL which is medially bowed by medial  compartment osteophytes and extruded meniscal tissue. Lateral  collateral ligament complex intact.   CARTILAGE   Patellofemoral:  No chondral defect.   Medial: Extensive full-thickness cartilage loss of the  weight-bearing medial compartment.   Lateral:  No chondral defect.   MISCELLANEOUS   Joint:  Small  joint effusion. Fat pads within normal limits.   Popliteal Fossa:  Small Baker's cyst. Intact popliteus tendon.   Extensor Mechanism:  Intact quadriceps and patellar tendons.   Bones: Medial compartment joint space loss with marginal osteophyte  formation. Reactive subchondral marrow edema of the medial femoral  condyle and medial tibial plateau. No acute fracture. No  dislocation. No marrow replacing bone lesion.   Other: Tendinosis of the distal semimembranosus tendon. Mild  nonspecific subcutaneous edema.   IMPRESSION:  1. Extensive full-thickness cartilage loss of the weight-bearing  medial compartment.  2. Complex tearing/maceration of the medial meniscal body and  posterior horn.  3. Small joint effusion and small Baker's cyst.  4. Tendinosis of the distal semimembranosus tendon.   Electronically Signed    By: Duanne Guess D.O.    On: 10/23/2021 15:12   Impression: Internal derangement of the right knee Degenerative arthrosis of the right knee  Plan:  The findings were discussed in detail with the patient. The patient was given informational material on knee arthroscopy. Conservative treatment options were reviewed with the patient. We discussed the risks and benefits of surgical intervention. Arthroscopy is an appropriate treatment for the meniscal pathology, but would have limited or no effect on degenerative changes of the articular cartilage. The usual perioperative course was also discussed in detail. The patient  expressed understanding of the risks and benefits of surgical intervention and would like to proceed with plans for right knee arthroscopy.  I spent a total of 40 minutes in both face-to-face and non-face-to-face activities, excluding procedures performed, for this visit on the date of this encounter.   MEDICAL CLEARANCE: Per anesthesiology. ACTIVITIES:  Avoid pivoting, squatting, or twisting. WORK STATUS: Not applicable. THERAPY: Quadriceps  strengthening exercises. MEDICATIONS: Requested Prescriptions   No prescriptions requested or ordered in this encounter   FOLLOW-UP: Return for postoperative follow-up.   Borden Thune P. Angie Fava., M.D.  This note was generated in part with voice recognition software and I apologize for any typographical errors that were not detected and corrected.  Electronically signed by Shari Heritage., MD at 11/02/2021 11:29 AM EST

## 2021-11-26 ENCOUNTER — Other Ambulatory Visit: Payer: Self-pay

## 2021-11-26 ENCOUNTER — Encounter
Admission: RE | Disposition: A | Discharge status: Home / Self Care | Payer: Self-pay | Source: Ambulatory Visit | Attending: Orthopedic Surgery

## 2021-11-26 ENCOUNTER — Encounter: Payer: Self-pay | Admitting: Orthopedic Surgery

## 2021-11-26 ENCOUNTER — Ambulatory Visit: Payer: Medicare PPO | Admitting: Anesthesiology

## 2021-11-26 ENCOUNTER — Ambulatory Visit
Admission: RE | Admit: 2021-11-26 | Discharge: 2021-11-26 | Disposition: A | Discharge status: Home / Self Care | Payer: Medicare PPO | Source: Ambulatory Visit | Attending: Orthopedic Surgery | Admitting: Orthopedic Surgery

## 2021-11-26 DIAGNOSIS — M23221 Derangement of posterior horn of medial meniscus due to old tear or injury, right knee: Secondary | ICD-10-CM | POA: Insufficient documentation

## 2021-11-26 DIAGNOSIS — M94261 Chondromalacia, right knee: Secondary | ICD-10-CM | POA: Diagnosis not present

## 2021-11-26 DIAGNOSIS — M25461 Effusion, right knee: Secondary | ICD-10-CM | POA: Diagnosis not present

## 2021-11-26 DIAGNOSIS — M1711 Unilateral primary osteoarthritis, right knee: Secondary | ICD-10-CM | POA: Insufficient documentation

## 2021-11-26 DIAGNOSIS — Z9889 Other specified postprocedural states: Secondary | ICD-10-CM

## 2021-11-26 HISTORY — PX: KNEE ARTHROSCOPY: SHX127

## 2021-11-26 SURGERY — ARTHROSCOPY, KNEE
Anesthesia: General | Site: Knee | Laterality: Right

## 2021-11-26 MED ORDER — LIDOCAINE HCL (CARDIAC) PF 100 MG/5ML IV SOSY
PREFILLED_SYRINGE | INTRAVENOUS | Status: DC | PRN
  Administered 2021-11-26: 100 mg via INTRAVENOUS

## 2021-11-26 MED ORDER — HYDROCODONE-ACETAMINOPHEN 5-325 MG PO TABS
1.0000 | ORAL_TABLET | ORAL | 0 refills | Status: DC | PRN
Start: 2021-11-26 — End: 2022-01-05

## 2021-11-26 MED ORDER — CHLORHEXIDINE GLUCONATE 0.12 % MT SOLN: 15.0000 mL | Freq: Once | OROMUCOSAL | Status: AC

## 2021-11-26 MED ORDER — FENTANYL CITRATE (PF) 100 MCG/2ML IJ SOLN
INTRAMUSCULAR | Status: AC
  Administered 2021-11-26: 18:00:00 25 ug via INTRAVENOUS
  Filled 2021-11-26: qty 2

## 2021-11-26 MED ORDER — FENTANYL CITRATE (PF) 100 MCG/2ML IJ SOLN
25.0000 ug | INTRAMUSCULAR | Status: DC | PRN
  Administered 2021-11-26 (×2): 25 ug via INTRAVENOUS

## 2021-11-26 MED ORDER — MIDAZOLAM HCL 2 MG/2ML IJ SOLN
INTRAMUSCULAR | Status: AC
  Filled 2021-11-26: qty 2

## 2021-11-26 MED ORDER — LACTATED RINGERS IV SOLN: INTRAVENOUS | Status: DC

## 2021-11-26 MED ORDER — BUPIVACAINE-EPINEPHRINE 0.25% -1:200000 IJ SOLN
INTRAMUSCULAR | Status: DC | PRN
  Administered 2021-11-26: 5 mL

## 2021-11-26 MED ORDER — PHENYLEPHRINE HCL-NACL 20-0.9 MG/250ML-% IV SOLN
INTRAVENOUS | Status: DC | PRN
  Administered 2021-11-26: 75 ug/min via INTRAVENOUS

## 2021-11-26 MED ORDER — DEXAMETHASONE SODIUM PHOSPHATE 10 MG/ML IJ SOLN
INTRAMUSCULAR | Status: DC | PRN
  Administered 2021-11-26: 10 mg via INTRAVENOUS

## 2021-11-26 MED ORDER — ACETAMINOPHEN 10 MG/ML IV SOLN
INTRAVENOUS | Status: DC | PRN
Start: 2021-11-26 — End: 2021-11-26
  Administered 2021-11-26: 1000 mg via INTRAVENOUS

## 2021-11-26 MED ORDER — MORPHINE SULFATE (PF) 2 MG/ML IV SOLN: 0.5000 mg | INTRAVENOUS | Status: DC | PRN

## 2021-11-26 MED ORDER — ONDANSETRON HCL 4 MG/2ML IJ SOLN: 4.0000 mg | Freq: Once | INTRAMUSCULAR | Status: DC | PRN

## 2021-11-26 MED ORDER — PROPOFOL 10 MG/ML IV BOLUS
INTRAVENOUS | Status: DC | PRN
Start: 2021-11-26 — End: 2021-11-26
  Administered 2021-11-26: 200 mg via INTRAVENOUS

## 2021-11-26 MED ORDER — FAMOTIDINE 20 MG PO TABS: 20.0000 mg | ORAL_TABLET | Freq: Once | ORAL | Status: AC

## 2021-11-26 MED ORDER — EPHEDRINE SULFATE (PRESSORS) 50 MG/ML IJ SOLN
INTRAMUSCULAR | Status: DC | PRN
  Administered 2021-11-26: 10 mg via INTRAVENOUS

## 2021-11-26 MED ORDER — ONDANSETRON HCL 4 MG/2ML IJ SOLN
INTRAMUSCULAR | Status: DC | PRN
  Administered 2021-11-26: 4 mg via INTRAVENOUS

## 2021-11-26 MED ORDER — MORPHINE SULFATE (PF) 4 MG/ML IV SOLN
INTRAVENOUS | Status: AC
  Filled 2021-11-26: qty 1

## 2021-11-26 MED ORDER — ACETAMINOPHEN 10 MG/ML IV SOLN
INTRAVENOUS | Status: AC
  Filled 2021-11-26: qty 100

## 2021-11-26 MED ORDER — RINGERS IRRIGATION IR SOLN
Status: DC | PRN
Start: 2021-11-26 — End: 2021-11-26
  Administered 2021-11-26: 6000 mL

## 2021-11-26 MED ORDER — OXYCODONE HCL 5 MG PO TABS
5.0000 mg | ORAL_TABLET | Freq: Once | ORAL | Status: AC | PRN
  Administered 2021-11-26: 18:00:00 5 mg via ORAL

## 2021-11-26 MED ORDER — CELECOXIB 200 MG PO CAPS
ORAL_CAPSULE | ORAL | Status: AC
  Administered 2021-11-26: 15:00:00 400 mg via ORAL
  Filled 2021-11-26: qty 2

## 2021-11-26 MED ORDER — ORAL CARE MOUTH RINSE: 15.0000 mL | Freq: Once | OROMUCOSAL | Status: AC

## 2021-11-26 MED ORDER — OXYCODONE HCL 5 MG/5ML PO SOLN: 5.0000 mg | Freq: Once | ORAL | Status: AC | PRN

## 2021-11-26 MED ORDER — OXYCODONE HCL 5 MG PO TABS
ORAL_TABLET | ORAL | Status: AC
  Filled 2021-11-26: qty 1

## 2021-11-26 MED ORDER — MORPHINE SULFATE 4 MG/ML IJ SOLN
INTRAMUSCULAR | Status: DC | PRN
  Administered 2021-11-26: 18:00:00 26 mL

## 2021-11-26 MED ORDER — PHENYLEPHRINE 40 MCG/ML (10ML) SYRINGE FOR IV PUSH (FOR BLOOD PRESSURE SUPPORT)
PREFILLED_SYRINGE | INTRAVENOUS | Status: DC | PRN
  Administered 2021-11-26: 80 ug via INTRAVENOUS
  Administered 2021-11-26: 160 ug via INTRAVENOUS

## 2021-11-26 MED ORDER — CELECOXIB 200 MG PO CAPS: 400.0000 mg | ORAL_CAPSULE | Freq: Once | ORAL | Status: AC

## 2021-11-26 MED ORDER — FENTANYL CITRATE (PF) 100 MCG/2ML IJ SOLN
INTRAMUSCULAR | Status: AC
  Filled 2021-11-26: qty 2

## 2021-11-26 MED ORDER — HYDROCODONE-ACETAMINOPHEN 5-325 MG PO TABS: 1.0000 | ORAL_TABLET | ORAL | Status: DC | PRN

## 2021-11-26 MED ORDER — BUPIVACAINE-EPINEPHRINE (PF) 0.25% -1:200000 IJ SOLN
INTRAMUSCULAR | Status: AC
  Filled 2021-11-26: qty 30

## 2021-11-26 MED ORDER — CHLORHEXIDINE GLUCONATE 0.12 % MT SOLN
OROMUCOSAL | Status: AC
  Administered 2021-11-26: 15:00:00 15 mL via OROMUCOSAL
  Filled 2021-11-26: qty 15

## 2021-11-26 MED ORDER — FAMOTIDINE 20 MG PO TABS
ORAL_TABLET | ORAL | Status: AC
  Administered 2021-11-26: 15:00:00 20 mg via ORAL
  Filled 2021-11-26: qty 1

## 2021-11-26 MED ORDER — PROPOFOL 10 MG/ML IV BOLUS
INTRAVENOUS | Status: AC
  Filled 2021-11-26: qty 20

## 2021-11-26 MED ORDER — FENTANYL CITRATE (PF) 100 MCG/2ML IJ SOLN
INTRAMUSCULAR | Status: DC | PRN
  Administered 2021-11-26 (×2): 50 ug via INTRAVENOUS

## 2021-11-26 MED ORDER — HYDROCODONE-ACETAMINOPHEN 7.5-325 MG PO TABS
1.0000 | ORAL_TABLET | ORAL | Status: DC | PRN
  Filled 2021-11-26: qty 2

## 2021-11-26 MED ORDER — ACETAMINOPHEN 10 MG/ML IV SOLN: 1000.0000 mg | Freq: Once | INTRAVENOUS | Status: DC | PRN

## 2021-11-26 SURGICAL SUPPLY — 31 items
ADAPTER IRRIG TUBE 2 SPIKE SOL (ADAPTER) ×6 IMPLANT
ADPR TBG 2 SPK PMP STRL ASCP (ADAPTER) ×2
BLADE SHAVER 4.5 DBL SERAT CV (CUTTER) IMPLANT
BNDG CMPR STD VLCR NS LF 5.8X6 (GAUZE/BANDAGES/DRESSINGS) ×1
BNDG ELASTIC 6X5.8 VLCR NS LF (GAUZE/BANDAGES/DRESSINGS) ×3 IMPLANT
DRAPE ARTHRO LIMB 89X125 STRL (DRAPES) ×6 IMPLANT
DRSG DERMACEA 8X12 NADH (GAUZE/BANDAGES/DRESSINGS) ×3 IMPLANT
DURAPREP 26ML APPLICATOR (WOUND CARE) ×3 IMPLANT
GAUZE SPONGE 4X4 12PLY STRL (GAUZE/BANDAGES/DRESSINGS) ×3 IMPLANT
GLOVE SURG ENC TEXT LTX SZ7.5 (GLOVE) ×3 IMPLANT
GLOVE SURG UNDER LTX SZ8 (GLOVE) ×3 IMPLANT
GOWN STRL REUS W/ TWL LRG LVL3 (GOWN DISPOSABLE) ×4 IMPLANT
GOWN STRL REUS W/TWL LRG LVL3 (GOWN DISPOSABLE) ×4
IV LACTATED RINGER IRRG 3000ML (IV SOLUTION) ×4
IV LR IRRIG 3000ML ARTHROMATIC (IV SOLUTION) ×4 IMPLANT
KIT TURNOVER KIT A (KITS) ×3 IMPLANT
MANIFOLD NEPTUNE II (INSTRUMENTS) ×3 IMPLANT
PACK ARTHROSCOPY KNEE (MISCELLANEOUS) ×3 IMPLANT
PADDING CAST 6X4YD NS (MISCELLANEOUS) ×1
PADDING CAST COTTON 6X4 NS (MISCELLANEOUS) ×2 IMPLANT
SHAVER BLADE TAPERED BLUNT 4 (BLADE) ×3 IMPLANT
SOL PREP PVP 2OZ (MISCELLANEOUS) ×2
SOLUTION PREP PVP 2OZ (MISCELLANEOUS) ×2 IMPLANT
SPONGE T-LAP 18X18 ~~LOC~~+RFID (SPONGE) ×3 IMPLANT
SUT ETHILON 3-0 FS-10 30 BLK (SUTURE) ×2
SUTURE EHLN 3-0 FS-10 30 BLK (SUTURE) ×2 IMPLANT
TUBING INFLOW SET DBFLO PUMP (TUBING) ×3 IMPLANT
TUBING OUTFLOW SET DBLFO PUMP (TUBING) ×3 IMPLANT
WAND HAND CNTRL MULTIVAC 50 (MISCELLANEOUS) ×3 IMPLANT
WATER STERILE IRR 500ML POUR (IV SOLUTION) ×2 IMPLANT
WRAP KNEE W/COLD PACKS 25.5X14 (SOFTGOODS) ×3 IMPLANT

## 2021-11-26 NOTE — Anesthesia Procedure Notes (Signed)
Procedure Name: LMA Insertion Date/Time: 11/26/2021 4:50 PM Performed by: Junious Silk, CRNA Pre-anesthesia Checklist: Patient identified, Patient being monitored, Timeout performed, Emergency Drugs available and Suction available Patient Re-evaluated:Patient Re-evaluated prior to induction Oxygen Delivery Method: Circle system utilized Preoxygenation: Pre-oxygenation with 100% oxygen Induction Type: IV induction Ventilation: Mask ventilation without difficulty LMA: LMA inserted LMA Size: 4.0 Tube type: Oral Number of attempts: 1 Placement Confirmation: positive ETCO2 and breath sounds checked- equal and bilateral Tube secured with: Tape Dental Injury: Teeth and Oropharynx as per pre-operative assessment

## 2021-11-26 NOTE — H&P (Signed)
The patient has been re-examined, and the chart reviewed, and there have been no interval changes to the documented history and physical.    The risks, benefits, and alternatives have been discussed at length. The patient expressed understanding of the risks benefits and agreed with plans for surgical intervention.  Haywood Meinders P. Helon Wisinski, Jr. M.D.    

## 2021-11-26 NOTE — Anesthesia Postprocedure Evaluation (Signed)
Anesthesia Post Note  Patient: Donald Kennedy  Procedure(s) Performed: ARTHROSCOPY KNEE; MENISCECTOMY; CHONDROPLASTY (Right: Knee)  Patient location during evaluation: PACU Anesthesia Type: General Level of consciousness: awake and alert Pain management: pain level controlled Vital Signs Assessment: post-procedure vital signs reviewed and stable Respiratory status: spontaneous breathing, nonlabored ventilation and respiratory function stable Cardiovascular status: blood pressure returned to baseline and stable Postop Assessment: no apparent nausea or vomiting Anesthetic complications: no   No notable events documented.   Last Vitals:  Vitals:   11/26/21 1815 11/26/21 1830  BP: (!) 150/85 (!) 156/94  Pulse: 79 73  Resp: 20 12  Temp:  36.7 C  SpO2: 99% 97%    Last Pain:  Vitals:   11/26/21 1830  TempSrc:   PainSc: 3                  Foye Deer

## 2021-11-26 NOTE — Op Note (Signed)
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   ASSISTANT: none  ANESTHESIA: general  ESTIMATED BLOOD LOSS: Minimal  FLUIDS REPLACED: 1500 mL of crystalloid  TOURNIQUET TIME: Not used  INDICATIONS FOR SURGERY: Donald Kennedy is a 66 y.o. year old male who has been seen for complaints of right knee pain. MRI demonstrated findings consistent with meniscal pathology. After discussion of the risks and benefits of surgical intervention, the patient expressed understanding of the risks benefits and agree with plans for right knee arthroscopy.   PROCEDURE IN DETAIL: The patient was brought into the operating room and, after adequate general anesthesia was achieved, a tourniquet was applied to the right thigh and the leg was placed in the leg holder. All bony prominences were well padded. The patient's right knee was cleaned and prepped with alcohol and Duraprep and draped in the usual sterile fashion. A "timeout" was performed as per usual protocol. The anticipated portal sites were injected with 0.25% Marcaine with epinephrine. An anterolateral incision was made and a cannula was inserted. A moderate effusion was evacuated and the knee was distended with fluid using the pump. The scope was advanced down the medial gutter into the medial compartment. Under visualization with the scope, an anteromedial portal was created and a hooked probe was inserted. The medial meniscus was visualized and probed.  There was a complex tear involving the posterior horn of the medial meniscus.  The tear  was debrided using meniscal punches and a 4.0 mm shaver.  Final contouring was performed using the 50 degree ArthroCare wand.  The remaining rim of meniscus was probed and felt to be stable.  The articular cartilage was visualized.  There was an area of grade IV chondromalacia involving the medial tibial plateau.  This area was debrided and contoured using the ArthroCare wand.  Grade 3 changes of chondromalacia were noted to the medial femoral condyle.  These areas were debrided and contoured using the ArthroCare wand.  The scope was then advanced into the intercondylar notch. The anterior cruciate ligament was visualized and probed and felt to be intact. The scope was removed from the lateral portal and reinserted via the anteromedial portal to better visualize the lateral compartment. The lateral meniscus was visualized and probed.  The lateral meniscus was probed and felt be stable.  The articular cartilage of the lateral compartment was visualized and noted to be in reasonably good condition.  Finally, the scope was advanced so as to visualize the patellofemoral articulation. Good patellar tracking was appreciated.  Only minimal chondral changes were appreciated to the articular surface.  The knee was irrigated with copius amounts of fluid and suctioned dry. The anterolateral portal was re-approximated with #3-0 nylon. A combination of 0.25% Marcaine with epinephrine and 4 mg of Morphine were injected via the scope. The scope was removed and the anteromedial portal was re-approximated with #3-0 nylon. A sterile dressing was applied followed by application of an ice wrap.  The patient tolerated the procedure well and was transported to the PACU in stable condition.  Shakedra Beam P. Holley Bouche., M.D.

## 2021-11-26 NOTE — Transfer of Care (Signed)
Immediate Anesthesia Transfer of Care Note  Patient: Donald Kennedy  Procedure(s) Performed: ARTHROSCOPY KNEE (Right: Knee)  Patient Location: PACU  Anesthesia Type:General  Level of Consciousness: sedated  Airway & Oxygen Therapy: Patient Spontanous Breathing and Patient connected to face mask oxygen  Post-op Assessment: Report given to RN and Post -op Vital signs reviewed and stable  Post vital signs: Reviewed and stable  Last Vitals:  Vitals Value Taken Time  BP    Temp    Pulse 75 11/26/21 1751  Resp 14 11/26/21 1751  SpO2 100 % 11/26/21 1751  Vitals shown include unvalidated device data.  Last Pain:  Vitals:   11/26/21 1502  TempSrc: Tympanic         Complications: No notable events documented.

## 2021-11-26 NOTE — Anesthesia Preprocedure Evaluation (Addendum)
Anesthesia Evaluation  Patient identified by MRN, date of birth, ID band Patient awake    Reviewed: Allergy & Precautions, NPO status , Patient's Chart, lab work & pertinent test results  History of Anesthesia Complications Negative for: history of anesthetic complications  Airway Mallampati: IV   Neck ROM: Full    Dental   Missing lower right and upper left molars:   Pulmonary asthma (childhood) ,    Pulmonary exam normal breath sounds clear to auscultation       Cardiovascular hypertension, Normal cardiovascular exam Rhythm:Regular Rate:Normal  ECG 11/19/21:  Normal sinus rhythm Left axis deviation Minimal voltage criteria for LVH, may be normal variant ( R in aVL )   Neuro/Psych PSYCHIATRIC DISORDERS Depression negative neurological ROS     GI/Hepatic negative GI ROS,   Endo/Other  Obesity   Renal/GU negative Renal ROS   BPH    Musculoskeletal  (+) Arthritis ,   Abdominal   Peds  Hematology negative hematology ROS (+)   Anesthesia Other Findings   Reproductive/Obstetrics                            Anesthesia Physical Anesthesia Plan  ASA: 2  Anesthesia Plan: General   Post-op Pain Management:    Induction: Intravenous  PONV Risk Score and Plan: 2 and Ondansetron, Dexamethasone and Treatment may vary due to age or medical condition  Airway Management Planned: LMA  Additional Equipment:   Intra-op Plan:   Post-operative Plan: Extubation in OR  Informed Consent: I have reviewed the patients History and Physical, chart, labs and discussed the procedure including the risks, benefits and alternatives for the proposed anesthesia with the patient or authorized representative who has indicated his/her understanding and acceptance.     Dental advisory given  Plan Discussed with: CRNA  Anesthesia Plan Comments: (Patient consented for risks of anesthesia including but not  limited to:  - adverse reactions to medications - damage to eyes, teeth, lips or other oral mucosa - nerve damage due to positioning  - sore throat or hoarseness - damage to heart, brain, nerves, lungs, other parts of body or loss of life  Informed patient about role of CRNA in peri- and intra-operative care.  Patient voiced understanding.)        Anesthesia Quick Evaluation

## 2021-11-27 ENCOUNTER — Encounter: Payer: Self-pay | Admitting: Orthopedic Surgery

## 2022-01-05 ENCOUNTER — Encounter
Admission: RE | Admit: 2022-01-05 | Discharge: 2022-01-05 | Disposition: A | Discharge status: Home / Self Care | Payer: Medicare PPO | Source: Ambulatory Visit | Attending: Orthopedic Surgery | Admitting: Orthopedic Surgery

## 2022-01-05 ENCOUNTER — Other Ambulatory Visit: Payer: Self-pay

## 2022-01-05 VITALS — BP 135/82 | HR 71 | Resp 16 | Ht 67.0 in | Wt 246.0 lb

## 2022-01-05 DIAGNOSIS — E785 Hyperlipidemia, unspecified: Secondary | ICD-10-CM | POA: Insufficient documentation

## 2022-01-05 DIAGNOSIS — N401 Enlarged prostate with lower urinary tract symptoms: Secondary | ICD-10-CM | POA: Insufficient documentation

## 2022-01-05 DIAGNOSIS — R351 Nocturia: Secondary | ICD-10-CM | POA: Insufficient documentation

## 2022-01-05 DIAGNOSIS — Z01818 Encounter for other preprocedural examination: Secondary | ICD-10-CM

## 2022-01-05 DIAGNOSIS — M1712 Unilateral primary osteoarthritis, left knee: Secondary | ICD-10-CM | POA: Insufficient documentation

## 2022-01-05 DIAGNOSIS — Z01812 Encounter for preprocedural laboratory examination: Secondary | ICD-10-CM | POA: Diagnosis not present

## 2022-01-05 DIAGNOSIS — I10 Essential (primary) hypertension: Secondary | ICD-10-CM | POA: Insufficient documentation

## 2022-01-05 LAB — URINALYSIS, ROUTINE W REFLEX MICROSCOPIC
Bilirubin Urine: NEGATIVE
Glucose, UA: NEGATIVE mg/dL
Hgb urine dipstick: NEGATIVE
Ketones, ur: NEGATIVE mg/dL
Leukocytes,Ua: NEGATIVE
Nitrite: NEGATIVE
Protein, ur: NEGATIVE mg/dL
Specific Gravity, Urine: 1.018 (ref 1.005–1.030)
pH: 5 (ref 5.0–8.0)

## 2022-01-05 LAB — SEDIMENTATION RATE: Sed Rate: 8 mm/hr (ref 0–20)

## 2022-01-05 LAB — TYPE AND SCREEN
ABO/RH(D): A POS
Antibody Screen: NEGATIVE

## 2022-01-05 LAB — SURGICAL PCR SCREEN
MRSA, PCR: NEGATIVE
Staphylococcus aureus: NEGATIVE

## 2022-01-05 LAB — C-REACTIVE PROTEIN: CRP: <0.5 mg/dL (ref <1.0)

## 2022-01-05 NOTE — Patient Instructions (Addendum)
Your procedure is scheduled on: 01/19/22 Report to DAY SURGERY DEPARTMENT LOCATED ON 2ND FLOOR MEDICAL MALL ENTRANCE. To find out your arrival time please call [phone] between 1PM - 3PM on 01/16/22.  Remember: Instructions that are not followed completely may result in serious medical risk, up to and including death, or upon the discretion of your surgeon and anesthesiologist your surgery may need to be rescheduled.     _X__ 1. Do not eat food after midnight the night before your procedure.                 No gum chewing or hard candies. You may drink clear liquids up to 2 hours                 before you are scheduled to arrive for your surgery- DO not drink clear                 liquids within 2 hours of the start of your surgery.                 Clear Liquids include:  water, apple juice without pulp, clear carbohydrate                 drink such as Clearfast or Gatorade, Black Coffee or Tea (Do not add                 anything to coffee or tea). Diabetics water only  __X__2.  On the morning of surgery brush your teeth with toothpaste and water, you                 may rinse your mouth with mouthwash if you wish.  Do not swallow any              toothpaste of mouthwash.     _X__ 3.  No Alcohol for 24 hours before or after surgery.   _X__ 4.  Do Not Smoke or use e-cigarettes For 24 Hours Prior to Your Surgery.                 Do not use any chewable tobacco products for at least 6 hours prior to                 surgery.  ____  5.  Bring all medications with you on the day of surgery if instructed.   __X__  6.  Notify your doctor if there is any change in your medical condition      (cold, fever, infections).     Do not wear jewelry, make-up, hairpins, clips or nail polish. Do not wear lotions, powders, or perfumes.  Do not shave body hair 48 hours prior to surgery. Men may shave face and neck. Do not bring valuables to the hospital.    [facility] is not responsible for any  belongings or valuables.  Contacts, dentures/partials or body piercings may not be worn into surgery. Bring a case for your contacts, glasses or hearing aids, a denture cup will be supplied. Leave your suitcase in the car. After surgery it may be brought to your room. For patients admitted to the hospital, discharge time is determined by your treatment team.   Patients discharged the day of surgery will not be allowed to drive home.   Please read over the following fact sheets that you were given:   MRSA Information, CHG soap, Incentive spirometer, Ensure drink  __X__ Take these medicines the morning of  surgery with A SIP OF WATER:    1. none  2.   3.   4.  5.  6.  ____ Fleet Enema (as directed)   __X__ Use CHG Soap/SAGE wipes as directed  ____ Use inhalers on the day of surgery  ____ Stop metformin/Janumet/Farxiga 2 days prior to surgery    ____ Take 1/2 of usual insulin dose the night before surgery. No insulin the morning          of surgery.   ____ Stop Blood Thinners Coumadin/Plavix/Xarelto/Pleta/Pradaxa/Eliquis/Effient/Aspirin  on   Or contact your Surgeon, Cardiologist or Medical Doctor regarding  ability to stop your blood thinners  __X__ Stop Anti-inflammatories 7 days before surgery such as Advil, Ibuprofen, Motrin,  BC or Goodies Powder, Naprosyn, Naproxen, Aleve, Aspirin   May take plain Tylenol as needed  __X__ Stop all herbals and supplements, fish oil or vitamins  until after surgery.    ____ Bring C-Pap to the hospital.

## 2022-01-16 ENCOUNTER — Other Ambulatory Visit: Admission: RE | Admit: 2022-01-16 | Payer: Medicare PPO | Source: Ambulatory Visit

## 2022-01-18 ENCOUNTER — Encounter: Payer: Self-pay | Admitting: Orthopedic Surgery

## 2022-01-18 MED ORDER — TRANEXAMIC ACID-NACL 1000-0.7 MG/100ML-% IV SOLN: 1000.0000 mg | INTRAVENOUS | Status: DC

## 2022-01-18 MED ORDER — FAMOTIDINE 20 MG PO TABS: 20.0000 mg | ORAL_TABLET | Freq: Once | ORAL | Status: AC

## 2022-01-18 MED ORDER — CELECOXIB 200 MG PO CAPS: 400.0000 mg | ORAL_CAPSULE | Freq: Once | ORAL | Status: AC

## 2022-01-18 MED ORDER — CHLORHEXIDINE GLUCONATE 0.12 % MT SOLN: 15.0000 mL | Freq: Once | OROMUCOSAL | Status: AC

## 2022-01-18 MED ORDER — GABAPENTIN 300 MG PO CAPS: 300.0000 mg | ORAL_CAPSULE | Freq: Once | ORAL | Status: AC

## 2022-01-18 MED ORDER — CHLORHEXIDINE GLUCONATE 4 % EX LIQD: 60.0000 mL | Freq: Once | CUTANEOUS | Status: DC

## 2022-01-18 MED ORDER — CEFAZOLIN SODIUM-DEXTROSE 2-4 GM/100ML-% IV SOLN
2.0000 g | INTRAVENOUS | Status: AC
  Administered 2022-01-19: 2 g via INTRAVENOUS

## 2022-01-18 MED ORDER — ORAL CARE MOUTH RINSE: 15.0000 mL | Freq: Once | OROMUCOSAL | Status: AC

## 2022-01-18 MED ORDER — LACTATED RINGERS IV SOLN: INTRAVENOUS | Status: DC

## 2022-01-18 MED ORDER — DEXAMETHASONE SODIUM PHOSPHATE 10 MG/ML IJ SOLN: 8.0000 mg | Freq: Once | INTRAMUSCULAR | Status: AC

## 2022-01-18 NOTE — H&P (Signed)
?ORTHOPAEDIC HISTORY & PHYSICAL ?Zitlali Primm, Florinda Marker., MD - 01/08/2022 11:00 AM EST ?Formatting of this note is different from the original. ?Images from the original note were not included. ?Chief Complaint: ?Chief Complaint  ?Patient presents with  ? Post Operative Visit  ?6 weeks right knee arthroscopy  ? Left knee degenerative arthrosis  ? ?Reason for Visit: ?The patient is a 66 y.o. male who presents today for reevaluation of his knees. He is 6 weeks status post right knee arthroscopy, partial medial meniscectomy, and chondroplasty. 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 He denies any problems with the portal sites. He continues to report sharp pain to the medial aspect of the right knee. The pain is aggravated by weightbearing activities but also interferes with his sleep.Marland Kitchen He reports some swelling, no locking, and some giving way of the knee. He is not using any ambulatory aids. ? ?He was scheduled for left total knee arthroplasty but at the present time he states that the right knee is more symptomatic than the left knee. He would like to proceed with total knee arthroplasty on the right knee instead of the left knee. ? ?Medications: ?Current Outpatient Medications  ?Medication Sig Dispense Refill  ? carboxymethylcellulose-glycerin (REFRESH OPTIVE) 0.5-0.9 % ophthalmic solution Place 1-2 drops into both eyes as needed for Dry Eyes  ? cetirizine (ZYRTEC) 10 MG tablet Take 10 mg by mouth once daily.  ? clobetasoL (TEMOVATE) 0.05 % cream Apply topically 2 (two) times daily (Patient taking differently: Apply topically 2 (two) times daily as needed) 60 g 0  ? HYDROcodone-acetaminophen (NORCO) 5-325 mg tablet TAKE 1 TO 2 TABLETS BY MOUTH EVERY 4 HOURS AS NEEDED FOR MODERATE PAIN  ? ibuprofen-acetaminophen 125-250 mg Tab Take 3 tablets by mouth every 6 (six) hours as needed  ? simvastatin (ZOCOR) 40 MG tablet Take 1 tablet (40 mg total) by mouth at bedtime for 180 days 90  tablet 1  ? tadalafiL (CIALIS) 5 MG tablet Take 1 tablet by mouth every other day  ? tamsulosin (FLOMAX) 0.4 mg capsule Take 0.4 mg by mouth once daily Take 30 minutes after same meal each day.  ? trolamine salicylate (ASPERCREME) 10 % cream Apply 1 Application topically 2 (two) times daily as needed  ? vit A/vit D3/vit E/vit K1/zinc (ADEK GUMMIES PLUS ZINC ORAL) Take 2 Gum by mouth once daily  ? amoxicillin-clavulanate (AUGMENTIN) 875-125 mg tablet Take 1 tablet (875 mg total) by mouth 2 (two) times daily for 10 days 20 tablet 0  ? moxifloxacin (VIGAMOX) 0.5 % ophthalmic solution Place 1 drop into the left eye 3 (three) times daily for 7 days 3 mL 0  ? predniSONE (DELTASONE) 20 MG tablet Take 1 tablet (20 mg total) by mouth 2 (two) times daily for 5 days 10 tablet 0  ? tamsulosin (FLOMAX) 0.4 mg capsule Take 1 capsule (0.4 mg total) by mouth once daily Take 30 minutes after same meal each day. 90 capsule 3  ? ?No current facility-administered medications for this visit.  ? ?Allergies: ?No Known Allergies ? ?Past Medical History: ?Past Medical History:  ?Diagnosis Date  ? Allergy 1962  ? Asthma, unspecified asthma severity, unspecified whether complicated, unspecified whether persistent When younger, just some whizzing now...  ? Carpal tunnel syndrome  ? Cervicalgia  ? Depression But much better last few years  ? Hyperlipidemia  ? Hypertension  ? OA (osteoarthritis)  ? ?Past Surgical History: ?Past Surgical History:  ?Procedure Laterality Date  ?  COLONOSCOPY 02/01/2012  ?Adenomatous Polyps: CBF 02/2015; recall ltr mailed 12/06/2014 (dw)  ? COLONOSCOPY 06/27/2015  ?Adenomatous Polyp: CBF 06/2020  ? COLONOSCOPY 08/04/2021  ?INTERNAL HEMORRHOIDS, DIVERTICULOSIS, CBC 2032/TKT  ? Right knee arthroscopy, partial medial meniscectomy, and chondroplasty 11/26/2021  ?Dr Marry Guan  ? ENDOSCOPIC CARPAL TUNNEL RELEASE Right  ? TONSILLECTOMY  ? ?Social History: ?Social History  ? ?Socioeconomic History  ? Marital status: Married   ?Spouse name: Kenney Houseman  ? Number of children: 1  ? Years of education: 67  ? Highest education level: Associate degree: occupational, Hotel manager, or vocational program  ?Occupational History  ? Occupation: Retired- Music therapist, [employer]  ?Comment: Farming now  ?Tobacco Use  ? Smoking status: Never  ? Smokeless tobacco: Never  ?Vaping Use  ? Vaping Use: Never used  ?Substance and Sexual Activity  ? Alcohol use: No  ?Alcohol/week: 0.0 standard drinks  ? Drug use: Never  ? Sexual activity: Defer  ?Partners: Female  ? ?Family History: ?Family History  ?Problem Relation Age of Onset  ? Stroke Mother  ? High blood pressure (Hypertension) Mother  ? Throat cancer Father  ? Coronary Artery Disease (Blocked arteries around heart) Father  ? Allergies Other  ?Sibling  ? ?Review of Systems: ?A comprehensive 14 point ROS was performed, reviewed, and the pertinent orthopaedic findings are documented in the HPI. ? ?Exam ?BP (!) 150/82  Ht 170.2 cm (5\' 7" )  Wt (!) 111.4 kg (245 lb 9.6 oz)  BMI 38.47 kg/m?  ? ?General:  ?Well-developed, well-nourished male seen in no acute distress.  ?Antalgic gait. ?Varus thrust to both knees. ? ?HEENT:  ?Atraumatic, normocephalic. Pupils are equal and reactive to light. Extraocular motion is intact. Sclera are clear. Oropharynx is clear with moist mucosa. ? ?Neck:  ?Supple, nontender, and with good ROM. No thyromegaly, adenopathy, JVD, or carotid bruits. ? ?Lungs:  ?Clear to auscultation bilaterally. ? ?Cardiovascular:  ?Regular rate and rhythm. Normal S1, S2. No murmur . No appreciable gallops or rubs. Peripheral pulses are palpable. No lower extremity edema. Homan`s test is negative. ? ?Abdomen:  ?Soft, nontender, nondistended. Bowel sounds are present. ? ?Extremities: ?Good strength, stability, and range of motion of the upper extremities. ?Good range of motion of the hips and ankles. ? ?Right Knee: ?Soft tissue swelling: mild ?Effusion: none ?Erythema: none ?Crepitance:  mild ?Tenderness: medial ?Alignment: relative varus ?Mediolateral laxity: stable ?Posterior sag: negative ?Patellar tracking: Good tracking without evidence of subluxation or tilt ?Atrophy: No significant atrophy.  ?Quadriceps tone was fair to good. ?Range of motion: 0/0/120 degrees ?  ?Left Knee: ?Soft tissue swelling: minimal ?Effusion: none ?Erythema: none ?Crepitance: mild ?Tenderness: medial ?Alignment: relative varus ?Mediolateral laxity: medial pseudolaxity ?Posterior sag: negative ?Patellar tracking: Good tracking without evidence of subluxation or tilt ?Atrophy: No significant atrophy.  ?Quadriceps tone was fair to good. ?Range of motion: 0/0/125 degrees ?  ?Neurologic:  ?Awake, alert, and oriented.  ?Sensory function is intact to pinprick and light touch.  ?Motor strength is judged to be 5/5.  ?Motor coordination is within normal limits.  ?No apparent clonus. No tremor.  ? ?X-rays: ?I ordered and interpreted standing AP, lateral, and sunrise radiographs of the right knee that were obtained in the office today. There is significant narrowing of the medial cartilage space with associated varus alignment. Osteophyte formation is noted. Subchondral sclerosis is noted. No evidence of fracture or dislocation.  ? ?I ordered and interpreted standing AP, lateral, and sunrise radiographs of the left knee that were obtained in  the office today. There is significant narrowing of the medial cartilage space with associated varus alignment. Osteophyte formation is noted. Subchondral sclerosis is noted. No evidence of fracture or dislocation.  ? ?Impression: ?Degenerative arthrosis of both knees, the right knee more symptomatic than the left knee ? ?Plan:  ?The findings were discussed in detail with the patient. ?The intraoperative pictures from the arthroscopy were reviewed with the patient. ? ?Conservative treatment options were reviewed with the patient. We discussed the risks and benefits of surgical intervention. The  usual perioperative course was also discussed in detail. The patient expressed understanding of the risks and benefits of surgical intervention and would like to proceed with plans for right total knee arthroplasty. ?

## 2022-01-19 ENCOUNTER — Ambulatory Visit: Payer: Medicare PPO | Admitting: Certified Registered Nurse Anesthetist

## 2022-01-19 ENCOUNTER — Encounter: Payer: Self-pay | Admitting: Orthopedic Surgery

## 2022-01-19 ENCOUNTER — Encounter
Admission: RE | Disposition: A | Discharge status: Home Health | Payer: Self-pay | Source: Home / Self Care | Attending: Orthopedic Surgery

## 2022-01-19 ENCOUNTER — Other Ambulatory Visit: Payer: Self-pay

## 2022-01-19 ENCOUNTER — Observation Stay
Admission: RE | Admit: 2022-01-19 | Discharge: 2022-01-20 | Disposition: A | Discharge status: Home Health | Payer: Medicare PPO | Attending: Orthopedic Surgery | Admitting: Orthopedic Surgery

## 2022-01-19 ENCOUNTER — Ambulatory Visit: Payer: Medicare PPO | Admitting: Urgent Care

## 2022-01-19 ENCOUNTER — Observation Stay: Payer: Medicare PPO

## 2022-01-19 DIAGNOSIS — Z96651 Presence of right artificial knee joint: Secondary | ICD-10-CM

## 2022-01-19 DIAGNOSIS — N401 Enlarged prostate with lower urinary tract symptoms: Secondary | ICD-10-CM

## 2022-01-19 DIAGNOSIS — M1711 Unilateral primary osteoarthritis, right knee: Principal | ICD-10-CM

## 2022-01-19 DIAGNOSIS — I10 Essential (primary) hypertension: Secondary | ICD-10-CM | POA: Diagnosis not present

## 2022-01-19 DIAGNOSIS — R351 Nocturia: Secondary | ICD-10-CM

## 2022-01-19 DIAGNOSIS — M1712 Unilateral primary osteoarthritis, left knee: Secondary | ICD-10-CM

## 2022-01-19 DIAGNOSIS — J45909 Unspecified asthma, uncomplicated: Secondary | ICD-10-CM | POA: Insufficient documentation

## 2022-01-19 DIAGNOSIS — Z79899 Other long term (current) drug therapy: Secondary | ICD-10-CM | POA: Insufficient documentation

## 2022-01-19 DIAGNOSIS — Z96659 Presence of unspecified artificial knee joint: Secondary | ICD-10-CM

## 2022-01-19 DIAGNOSIS — E785 Hyperlipidemia, unspecified: Secondary | ICD-10-CM

## 2022-01-19 HISTORY — PX: KNEE ARTHROPLASTY: SHX992

## 2022-01-19 LAB — ABO/RH: ABO/RH(D): A POS

## 2022-01-19 SURGERY — ARTHROPLASTY, KNEE, TOTAL, USING IMAGELESS COMPUTER-ASSISTED NAVIGATION
Anesthesia: Spinal | Site: Knee | Laterality: Right

## 2022-01-19 MED ORDER — PROPOFOL 1000 MG/100ML IV EMUL
INTRAVENOUS | Status: AC
  Filled 2022-01-19: qty 100

## 2022-01-19 MED ORDER — HYDROMORPHONE HCL 1 MG/ML IJ SOLN: 0.5000 mg | INTRAMUSCULAR | Status: DC | PRN

## 2022-01-19 MED ORDER — BUPIVACAINE HCL (PF) 0.5 % IJ SOLN
INTRAMUSCULAR | Status: DC | PRN
  Administered 2022-01-19: 3 mL

## 2022-01-19 MED ORDER — PRONTOSAN WOUND IRRIGATION OPTIME
TOPICAL | Status: DC | PRN
  Administered 2022-01-19: 1 via TOPICAL

## 2022-01-19 MED ORDER — TRANEXAMIC ACID-NACL 1000-0.7 MG/100ML-% IV SOLN
INTRAVENOUS | Status: DC | PRN
  Administered 2022-01-19: 1000 mg via INTRAVENOUS

## 2022-01-19 MED ORDER — ENSURE PRE-SURGERY PO LIQD
296.0000 mL | Freq: Once | ORAL | Status: AC
  Administered 2022-01-19: 296 mL via ORAL
  Filled 2022-01-19: qty 296

## 2022-01-19 MED ORDER — CEFAZOLIN SODIUM-DEXTROSE 2-4 GM/100ML-% IV SOLN
2.0000 g | Freq: Four times a day (QID) | INTRAVENOUS | Status: AC
  Administered 2022-01-19: 2 g via INTRAVENOUS
  Filled 2022-01-19 (×4): qty 100

## 2022-01-19 MED ORDER — SODIUM CHLORIDE 0.9 % IR SOLN
Status: DC | PRN
  Administered 2022-01-19: 3000 mL

## 2022-01-19 MED ORDER — PHENYLEPHRINE 40 MCG/ML (10ML) SYRINGE FOR IV PUSH (FOR BLOOD PRESSURE SUPPORT)
PREFILLED_SYRINGE | INTRAVENOUS | Status: DC | PRN
  Administered 2022-01-19 (×3): 80 ug via INTRAVENOUS

## 2022-01-19 MED ORDER — ACETAMINOPHEN 10 MG/ML IV SOLN
INTRAVENOUS | Status: DC | PRN
  Administered 2022-01-19: 1000 mg via INTRAVENOUS

## 2022-01-19 MED ORDER — SODIUM CHLORIDE 0.9 % IV SOLN: INTRAVENOUS | Status: DC

## 2022-01-19 MED ORDER — FERROUS SULFATE 325 (65 FE) MG PO TABS
325.0000 mg | ORAL_TABLET | Freq: Two times a day (BID) | ORAL | Status: DC
  Administered 2022-01-19 – 2022-01-20 (×2): 325 mg via ORAL
  Filled 2022-01-19 (×2): qty 1

## 2022-01-19 MED ORDER — PHENYLEPHRINE HCL-NACL 20-0.9 MG/250ML-% IV SOLN
INTRAVENOUS | Status: DC | PRN
  Administered 2022-01-19: 50 ug/min via INTRAVENOUS

## 2022-01-19 MED ORDER — PROPYLENE GLYCOL 0.6 % OP SOLN: 1.0000 [drp] | Freq: Every day | OPHTHALMIC | Status: DC | PRN

## 2022-01-19 MED ORDER — MIDAZOLAM HCL 5 MG/5ML IJ SOLN
INTRAMUSCULAR | Status: DC | PRN
  Administered 2022-01-19: 2 mg via INTRAVENOUS

## 2022-01-19 MED ORDER — DEXAMETHASONE SODIUM PHOSPHATE 10 MG/ML IJ SOLN
INTRAMUSCULAR | Status: AC
  Administered 2022-01-19: 8 mg via INTRAVENOUS
  Filled 2022-01-19: qty 1

## 2022-01-19 MED ORDER — FENTANYL CITRATE (PF) 100 MCG/2ML IJ SOLN: 25.0000 ug | INTRAMUSCULAR | Status: DC | PRN

## 2022-01-19 MED ORDER — PHENOL 1.4 % MT LIQD
1.0000 | OROMUCOSAL | Status: DC | PRN
  Filled 2022-01-19: qty 177

## 2022-01-19 MED ORDER — ONDANSETRON HCL 4 MG/2ML IJ SOLN: 4.0000 mg | Freq: Four times a day (QID) | INTRAMUSCULAR | Status: DC | PRN

## 2022-01-19 MED ORDER — POLYVINYL ALCOHOL 1.4 % OP SOLN
1.0000 [drp] | OPHTHALMIC | Status: DC | PRN
  Filled 2022-01-19: qty 15

## 2022-01-19 MED ORDER — TRANEXAMIC ACID-NACL 1000-0.7 MG/100ML-% IV SOLN
INTRAVENOUS | Status: AC
  Administered 2022-01-19: 1000 mg via INTRAVENOUS
  Filled 2022-01-19: qty 100

## 2022-01-19 MED ORDER — LORATADINE 10 MG PO TABS
10.0000 mg | ORAL_TABLET | Freq: Every day | ORAL | Status: DC
Start: 2022-01-19 — End: 2022-01-20
  Administered 2022-01-19: 10 mg via ORAL
  Filled 2022-01-19 (×2): qty 1

## 2022-01-19 MED ORDER — PROPOFOL 10 MG/ML IV BOLUS
INTRAVENOUS | Status: DC | PRN
  Administered 2022-01-19: 10 mg via INTRAVENOUS

## 2022-01-19 MED ORDER — ONDANSETRON HCL 4 MG/2ML IJ SOLN: 4.0000 mg | Freq: Once | INTRAMUSCULAR | Status: DC | PRN

## 2022-01-19 MED ORDER — FENTANYL CITRATE (PF) 100 MCG/2ML IJ SOLN
INTRAMUSCULAR | Status: DC | PRN
  Administered 2022-01-19: 25 ug via INTRAVENOUS
  Administered 2022-01-19: 50 ug via INTRAVENOUS
  Administered 2022-01-19: 25 ug via INTRAVENOUS

## 2022-01-19 MED ORDER — ONDANSETRON HCL 4 MG/2ML IJ SOLN
INTRAMUSCULAR | Status: DC | PRN
Start: 2022-01-19 — End: 2022-01-19
  Administered 2022-01-19: 4 mg via INTRAVENOUS

## 2022-01-19 MED ORDER — TAMSULOSIN HCL 0.4 MG PO CAPS
0.4000 mg | ORAL_CAPSULE | Freq: Every day | ORAL | Status: DC
  Filled 2022-01-19: qty 1

## 2022-01-19 MED ORDER — FLEET ENEMA 7-19 GM/118ML RE ENEM: 1.0000 | ENEMA | Freq: Once | RECTAL | Status: DC | PRN

## 2022-01-19 MED ORDER — OXYCODONE HCL 5 MG PO TABS
10.0000 mg | ORAL_TABLET | ORAL | Status: DC | PRN
  Filled 2022-01-19 (×2): qty 2

## 2022-01-19 MED ORDER — MIDAZOLAM HCL 2 MG/2ML IJ SOLN
INTRAMUSCULAR | Status: AC
  Filled 2022-01-19: qty 2

## 2022-01-19 MED ORDER — GABAPENTIN 300 MG PO CAPS
ORAL_CAPSULE | ORAL | Status: AC
  Administered 2022-01-19: 300 mg via ORAL
  Filled 2022-01-19: qty 1

## 2022-01-19 MED ORDER — SODIUM CHLORIDE 0.9 % IV SOLN
INTRAVENOUS | Status: DC | PRN
  Administered 2022-01-19: 60 mL

## 2022-01-19 MED ORDER — FAMOTIDINE 20 MG PO TABS
ORAL_TABLET | ORAL | Status: AC
Start: 2022-01-19 — End: 2022-01-19
  Administered 2022-01-19: 20 mg via ORAL
  Filled 2022-01-19: qty 1

## 2022-01-19 MED ORDER — ALUM & MAG HYDROXIDE-SIMETH 200-200-20 MG/5ML PO SUSP: 30.0000 mL | ORAL | Status: DC | PRN

## 2022-01-19 MED ORDER — CHLORHEXIDINE GLUCONATE 0.12 % MT SOLN
OROMUCOSAL | Status: AC
  Administered 2022-01-19: 15 mL via OROMUCOSAL
  Filled 2022-01-19: qty 15

## 2022-01-19 MED ORDER — CEFAZOLIN SODIUM-DEXTROSE 2-4 GM/100ML-% IV SOLN
INTRAVENOUS | Status: AC
  Administered 2022-01-19: 2 g via INTRAVENOUS
  Filled 2022-01-19: qty 100

## 2022-01-19 MED ORDER — OXYCODONE HCL 5 MG PO TABS
5.0000 mg | ORAL_TABLET | ORAL | Status: DC | PRN
  Administered 2022-01-19 – 2022-01-20 (×3): 5 mg via ORAL
  Filled 2022-01-19: qty 1

## 2022-01-19 MED ORDER — PANTOPRAZOLE SODIUM 40 MG PO TBEC
40.0000 mg | DELAYED_RELEASE_TABLET | Freq: Two times a day (BID) | ORAL | Status: DC
  Administered 2022-01-19 – 2022-01-20 (×2): 40 mg via ORAL
  Filled 2022-01-19 (×2): qty 1

## 2022-01-19 MED ORDER — MAGNESIUM HYDROXIDE 400 MG/5ML PO SUSP
30.0000 mL | Freq: Every day | ORAL | Status: DC
  Administered 2022-01-19 – 2022-01-20 (×2): 30 mL via ORAL
  Filled 2022-01-19 (×2): qty 30

## 2022-01-19 MED ORDER — FENTANYL CITRATE (PF) 100 MCG/2ML IJ SOLN
INTRAMUSCULAR | Status: AC
  Filled 2022-01-19: qty 2

## 2022-01-19 MED ORDER — SIMVASTATIN 20 MG PO TABS
40.0000 mg | ORAL_TABLET | Freq: Every day | ORAL | Status: DC
  Filled 2022-01-19: qty 2

## 2022-01-19 MED ORDER — ONDANSETRON HCL 4 MG PO TABS: 4.0000 mg | ORAL_TABLET | Freq: Four times a day (QID) | ORAL | Status: DC | PRN

## 2022-01-19 MED ORDER — SENNOSIDES-DOCUSATE SODIUM 8.6-50 MG PO TABS
1.0000 | ORAL_TABLET | Freq: Two times a day (BID) | ORAL | Status: DC
  Administered 2022-01-19 – 2022-01-20 (×2): 1 via ORAL
  Filled 2022-01-19 (×2): qty 1

## 2022-01-19 MED ORDER — DIPHENHYDRAMINE HCL 12.5 MG/5ML PO ELIX: 12.5000 mg | ORAL_SOLUTION | ORAL | Status: DC | PRN

## 2022-01-19 MED ORDER — CLOBETASOL PROPIONATE 0.05 % EX CREA
1.0000 "application " | TOPICAL_CREAM | Freq: Two times a day (BID) | CUTANEOUS | Status: DC | PRN
  Filled 2022-01-19: qty 15

## 2022-01-19 MED ORDER — NEOMYCIN-POLYMYXIN B GU 40-200000 IR SOLN
Status: DC | PRN
  Administered 2022-01-19: 14 mL

## 2022-01-19 MED ORDER — CELECOXIB 200 MG PO CAPS
ORAL_CAPSULE | ORAL | Status: AC
  Administered 2022-01-19: 400 mg via ORAL
  Filled 2022-01-19: qty 2

## 2022-01-19 MED ORDER — ACETAMINOPHEN 325 MG PO TABS: 325.0000 mg | ORAL_TABLET | Freq: Four times a day (QID) | ORAL | Status: DC | PRN

## 2022-01-19 MED ORDER — BISACODYL 10 MG RE SUPP: 10.0000 mg | Freq: Every day | RECTAL | Status: DC | PRN

## 2022-01-19 MED ORDER — TRANEXAMIC ACID-NACL 1000-0.7 MG/100ML-% IV SOLN
INTRAVENOUS | Status: AC
  Filled 2022-01-19: qty 100

## 2022-01-19 MED ORDER — METOCLOPRAMIDE HCL 10 MG PO TABS
10.0000 mg | ORAL_TABLET | Freq: Three times a day (TID) | ORAL | Status: DC
Start: 2022-01-19 — End: 2022-01-20
  Administered 2022-01-19 – 2022-01-20 (×3): 10 mg via ORAL
  Filled 2022-01-19 (×3): qty 1

## 2022-01-19 MED ORDER — PROPOFOL 500 MG/50ML IV EMUL
INTRAVENOUS | Status: DC | PRN
  Administered 2022-01-19: 100 ug/kg/min via INTRAVENOUS

## 2022-01-19 MED ORDER — 0.9 % SODIUM CHLORIDE (POUR BTL) OPTIME
TOPICAL | Status: DC | PRN
  Administered 2022-01-19: 500 mL

## 2022-01-19 MED ORDER — BUPIVACAINE HCL (PF) 0.25 % IJ SOLN
INTRAMUSCULAR | Status: DC | PRN
  Administered 2022-01-19: 60 mL

## 2022-01-19 MED ORDER — ENOXAPARIN SODIUM 30 MG/0.3ML IJ SOSY
30.0000 mg | PREFILLED_SYRINGE | Freq: Two times a day (BID) | INTRAMUSCULAR | Status: DC
  Administered 2022-01-20: 30 mg via SUBCUTANEOUS
  Filled 2022-01-19: qty 0.3

## 2022-01-19 MED ORDER — TRAMADOL HCL 50 MG PO TABS
50.0000 mg | ORAL_TABLET | ORAL | Status: DC | PRN
  Administered 2022-01-19: 100 mg via ORAL
  Filled 2022-01-19: qty 2

## 2022-01-19 MED ORDER — CELECOXIB 200 MG PO CAPS
200.0000 mg | ORAL_CAPSULE | Freq: Two times a day (BID) | ORAL | Status: DC
  Administered 2022-01-20: 200 mg via ORAL
  Filled 2022-01-19 (×2): qty 1

## 2022-01-19 MED ORDER — PROPOFOL 10 MG/ML IV BOLUS
INTRAVENOUS | Status: AC
  Filled 2022-01-19: qty 20

## 2022-01-19 MED ORDER — ACETAMINOPHEN 10 MG/ML IV SOLN
1000.0000 mg | Freq: Four times a day (QID) | INTRAVENOUS | Status: DC
  Administered 2022-01-19 – 2022-01-20 (×2): 1000 mg via INTRAVENOUS
  Filled 2022-01-19 (×2): qty 100

## 2022-01-19 MED ORDER — MENTHOL 3 MG MT LOZG
1.0000 | LOZENGE | OROMUCOSAL | Status: DC | PRN
  Filled 2022-01-19: qty 9

## 2022-01-19 MED ORDER — TRANEXAMIC ACID-NACL 1000-0.7 MG/100ML-% IV SOLN: 1000.0000 mg | Freq: Once | INTRAVENOUS | Status: AC

## 2022-01-19 SURGICAL SUPPLY — 78 items
ATTUNE MED DOME PAT 38 KNEE (Knees) ×1 IMPLANT
ATTUNE PS FEM RT SZ 6 CEM KNEE (Femur) ×1 IMPLANT
ATTUNE PSRP INSR SZ6 5 KNEE (Insert) ×1 IMPLANT
BASE TIBIAL ROT PLAT SZ 7 KNEE (Knees) IMPLANT
BATTERY INSTRU NAVIGATION (MISCELLANEOUS) ×12 IMPLANT
BLADE SAW 70X12.5 (BLADE) ×3 IMPLANT
BLADE SAW 90X13X1.19 OSCILLAT (BLADE) ×3 IMPLANT
BLADE SAW 90X25X1.19 OSCILLAT (BLADE) ×3 IMPLANT
BONE CEMENT GENTAMICIN (Cement) ×4 IMPLANT
BSPLAT TIB 7 CMNT ROT PLAT STR (Knees) ×1 IMPLANT
BTRY SRG DRVR LF (MISCELLANEOUS) ×4
CEMENT BONE GENTAMICIN 40 (Cement) IMPLANT
COOLER POLAR GLACIER W/PUMP (MISCELLANEOUS) ×3 IMPLANT
CUFF TOURN SGL QUICK 24 (TOURNIQUET CUFF)
CUFF TOURN SGL QUICK 34 (TOURNIQUET CUFF)
CUFF TRNQT CYL 24X4X16.5-23 (TOURNIQUET CUFF) IMPLANT
CUFF TRNQT CYL 34X4.125X (TOURNIQUET CUFF) IMPLANT
DRAPE 3/4 80X56 (DRAPES) ×3 IMPLANT
DRAPE INCISE IOBAN 66X45 STRL (DRAPES) ×3 IMPLANT
DRSG DERMACEA 8X12 NADH (GAUZE/BANDAGES/DRESSINGS) ×3 IMPLANT
DRSG MEPILEX SACRM 8.7X9.8 (GAUZE/BANDAGES/DRESSINGS) ×3 IMPLANT
DRSG OPSITE POSTOP 4X14 (GAUZE/BANDAGES/DRESSINGS) ×3 IMPLANT
DRSG TEGADERM 4X4.75 (GAUZE/BANDAGES/DRESSINGS) ×3 IMPLANT
DURAPREP 26ML APPLICATOR (WOUND CARE) ×6 IMPLANT
ELECT CAUTERY BLADE 6.4 (BLADE) ×3 IMPLANT
ELECT REM PT RETURN 9FT ADLT (ELECTROSURGICAL) ×2
ELECTRODE REM PT RTRN 9FT ADLT (ELECTROSURGICAL) ×2 IMPLANT
EX-PIN ORTHOLOCK NAV 4X150 (PIN) ×6 IMPLANT
GLOVE SRG 8 PF TXTR STRL LF DI (GLOVE) ×2 IMPLANT
GLOVE SURG ENC TEXT LTX SZ7.5 (GLOVE) ×12 IMPLANT
GLOVE SURG UNDER POLY LF SZ7.5 (GLOVE) ×3 IMPLANT
GLOVE SURG UNDER POLY LF SZ8 (GLOVE) ×2
GOWN STRL REUS W/ TWL LRG LVL3 (GOWN DISPOSABLE) ×4 IMPLANT
GOWN STRL REUS W/ TWL XL LVL3 (GOWN DISPOSABLE) ×2 IMPLANT
GOWN STRL REUS W/TWL LRG LVL3 (GOWN DISPOSABLE) ×4
GOWN STRL REUS W/TWL XL LVL3 (GOWN DISPOSABLE) ×2
HEMOVAC 400CC 10FR (MISCELLANEOUS) ×3 IMPLANT
HOLDER FOLEY CATH W/STRAP (MISCELLANEOUS) ×3 IMPLANT
HOLSTER ELECTROSUGICAL PENCIL (MISCELLANEOUS) ×3 IMPLANT
IV NS IRRIG 3000ML ARTHROMATIC (IV SOLUTION) ×3 IMPLANT
KIT TURNOVER KIT A (KITS) ×3 IMPLANT
KNIFE SCULPS 14X20 (INSTRUMENTS) ×3 IMPLANT
LABEL OR SOLS (LABEL) ×3 IMPLANT
MANIFOLD NEPTUNE II (INSTRUMENTS) ×6 IMPLANT
NDL SAFETY ECLIPSE 18X1.5 (NEEDLE) ×2 IMPLANT
NDL SPNL 20GX3.5 QUINCKE YW (NEEDLE) ×4 IMPLANT
NEEDLE HYPO 18GX1.5 SHARP (NEEDLE) ×2
NEEDLE SPNL 20GX3.5 QUINCKE YW (NEEDLE) ×4 IMPLANT
NS IRRIG 500ML POUR BTL (IV SOLUTION) ×3 IMPLANT
PACK TOTAL KNEE (MISCELLANEOUS) ×3 IMPLANT
PAD ABD DERMACEA PRESS 5X9 (GAUZE/BANDAGES/DRESSINGS) ×6 IMPLANT
PAD WRAPON POLAR KNEE (MISCELLANEOUS) ×2 IMPLANT
PENCIL SMOKE EVACUATOR COATED (MISCELLANEOUS) ×3 IMPLANT
PIN DRILL FIX HALF THREAD (BIT) ×6 IMPLANT
PIN DRILL QUICK PACK ×6 IMPLANT
PIN FIXATION 1/8DIA X 3INL (PIN) ×3 IMPLANT
PULSAVAC PLUS IRRIG FAN TIP (DISPOSABLE) ×2
SOL PREP PVP 2OZ (MISCELLANEOUS) ×2
SOLUTION PREP PVP 2OZ (MISCELLANEOUS) ×2 IMPLANT
SOLUTION PRONTOSAN WOUND 350ML (IRRIGATION / IRRIGATOR) ×3 IMPLANT
SPONGE DRAIN TRACH 4X4 STRL 2S (GAUZE/BANDAGES/DRESSINGS) ×3 IMPLANT
STAPLER SKIN PROX 35W (STAPLE) ×3 IMPLANT
STOCKINETTE IMPERV 14X48 (MISCELLANEOUS) ×1 IMPLANT
STRAP TIBIA SHORT (MISCELLANEOUS) ×3 IMPLANT
SUCTION FRAZIER HANDLE 10FR (MISCELLANEOUS) ×2
SUCTION TUBE FRAZIER 10FR DISP (MISCELLANEOUS) ×2 IMPLANT
SUT VIC AB 0 CT1 36 (SUTURE) ×6 IMPLANT
SUT VIC AB 1 CT1 36 (SUTURE) ×6 IMPLANT
SUT VIC AB 2-0 CT2 27 (SUTURE) ×3 IMPLANT
SYR 20ML LL LF (SYRINGE) ×3 IMPLANT
SYR 30ML LL (SYRINGE) ×6 IMPLANT
TIBIAL BASE ROT PLAT SZ 7 KNEE (Knees) ×2 IMPLANT
TIP FAN IRRIG PULSAVAC PLUS (DISPOSABLE) ×2 IMPLANT
TOWEL OR 17X26 4PK STRL BLUE (TOWEL DISPOSABLE) ×3 IMPLANT
TOWER CARTRIDGE SMART MIX (DISPOSABLE) ×3 IMPLANT
TRAY FOLEY MTR SLVR 16FR STAT (SET/KITS/TRAYS/PACK) ×3 IMPLANT
WATER STERILE IRR 500ML POUR (IV SOLUTION) ×3 IMPLANT
WRAPON POLAR PAD KNEE (MISCELLANEOUS) ×2

## 2022-01-19 NOTE — Anesthesia Preprocedure Evaluation (Signed)
Anesthesia Evaluation  ?Patient identified by MRN, date of birth, ID band ?Patient awake ? ? ? ?Reviewed: ?Allergy & Precautions, H&P , NPO status , Patient's Chart, lab work & pertinent test results, reviewed documented beta blocker date and time  ? ?Airway ?Mallampati: III ? ? ?Neck ROM: full ? ? ? Dental ? ?(+) Teeth Intact ?  ?Pulmonary ?asthma ,  ?  ?Pulmonary exam normal ? ? ? ? ? ? ? Cardiovascular ?hypertension, On Medications ?negative cardio ROS ?Normal cardiovascular exam ?Rhythm:regular Rate:Normal ? ? ?  ?Neuro/Psych ?Depression  Neuromuscular disease negative psych ROS  ? GI/Hepatic ?negative GI ROS, Neg liver ROS,   ?Endo/Other  ?negative endocrine ROS ? Renal/GU ?negative Renal ROS  ?negative genitourinary ?  ?Musculoskeletal ? ? Abdominal ?  ?Peds ? Hematology ?negative hematology ROS ?(+)   ?Anesthesia Other Findings ?Past Medical History: ?No date: Asthma ?    Comment:  as a child ?No date: BPH (benign prostatic hyperplasia) ?No date: Carpal tunnel syndrome ?No date: Hyperlipidemia ?2019: Hypertension ?    Comment:  only took medication for a short time; no longer on  ?             medication (wants removed from record) ?No date: Osteoarthritis ?Past Surgical History: ?No date: CARPAL TUNNEL RELEASE; Right ?    Comment:  1980's ?No date: COLONOSCOPY ?    Comment:  2013, 2016, 2022 ?11/26/2021: KNEE ARTHROSCOPY; Right ?    Comment:  Procedure: ARTHROSCOPY KNEE; MENISCECTOMY;  ?             CHONDROPLASTY;  Surgeon: Donato Heinz, MD;  Location:  ?             [facility] ORS;  Service: Orthopedics;  Laterality: Right; ?No date: NASAL SINUS SURGERY ?No date: REFRACTIVE SURGERY; Bilateral ?    Comment:  Lasik ?1960: TONSILLECTOMY ?BMI   ? Body Mass Index: 37.28 kg/m?  ?  ? Reproductive/Obstetrics ?negative OB ROS ? ?  ? ? ? ? ? ? ? ? ? ? ? ? ? ?  ?  ? ? ? ? ? ? ? ? ?Anesthesia Physical ?Anesthesia Plan ? ?ASA: 3 ? ?Anesthesia Plan: Spinal  ? ?Post-op Pain Management:    ? ?Induction:  ? ?PONV Risk Score and Plan: 2 ? ?Airway Management Planned:  ? ?Additional Equipment:  ? ?Intra-op Plan:  ? ?Post-operative Plan:  ? ?Informed Consent: I have reviewed the patients History and Physical, chart, labs and discussed the procedure including the risks, benefits and alternatives for the proposed anesthesia with the patient or authorized representative who has indicated his/her understanding and acceptance.  ? ? ? ?Dental Advisory Given ? ?Plan Discussed with: CRNA ? ?Anesthesia Plan Comments:   ? ? ? ? ? ? ?Anesthesia Quick Evaluation ? ?

## 2022-01-19 NOTE — H&P (Signed)
The patient has been re-examined, and the chart reviewed, and there have been no interval changes to the documented history and physical.    The risks, benefits, and alternatives have been discussed at length. The patient expressed understanding of the risks benefits and agreed with plans for surgical intervention.  Donald Kennedy, Jr. M.D.    

## 2022-01-19 NOTE — Plan of Care (Signed)
?  Problem: Health Behavior/Discharge Planning: ?Goal: Ability to manage health-related needs will improve ?Outcome: Progressing ?  ?Problem: Clinical Measurements: ?Goal: Ability to maintain clinical measurements within normal limits will improve ?Outcome: Progressing ?  ?Problem: Education: ?Goal: Individualized Educational Video(s) ?Outcome: Progressing ?  ?Problem: Activity: ?Goal: Ability to avoid complications of mobility impairment will improve ?Outcome: Progressing ?Goal: Range of joint motion will improve ?Outcome: Progressing ?  ?Problem: Activity: ?Goal: Range of joint motion will improve ?Outcome: Progressing ?  ?Problem: Clinical Measurements: ?Goal: Postoperative complications will be avoided or minimized ?Outcome: Progressing ?  ?

## 2022-01-19 NOTE — Transfer of Care (Signed)
Immediate Anesthesia Transfer of Care Note ? ?Patient: Donald Kennedy ? ?Procedure(s) Performed: COMPUTER ASSISTED TOTAL KNEE ARTHROPLASTY (Right: Knee) ? ?Patient Location: PACU ? ?Anesthesia Type:Spinal ? ?Level of Consciousness: awake, alert  and oriented ? ?Airway & Oxygen Therapy: Patient Spontanous Breathing ? ?Post-op Assessment: Report given to RN and Post -op Vital signs reviewed and stable ? ?Post vital signs: Reviewed and stable ? ?Last Vitals:  ?Vitals Value Taken Time  ?BP 118/77 01/19/22 1455  ?Temp 36.2 ?C 01/19/22 1455  ?Pulse 72 01/19/22 1457  ?Resp 15 01/19/22 1457  ?SpO2 95 % 01/19/22 1457  ?Vitals shown include unvalidated device data. ? ?Last Pain:  ?Vitals:  ? 01/19/22 1004  ?TempSrc: Oral  ?   ? ?  ? ?Complications: No notable events documented. ?

## 2022-01-19 NOTE — Evaluation (Signed)
Physical Therapy Evaluation ?Patient Details ?Name: Donald Kennedy ?MRN: [id number] ?DOB: [birth year] ?Today's Date: 01/19/2022 ? ?History of Present Illness ? Pt is a 66 yo M with a PMH of HTN who was diagnosed with degenerative arthrosis of the right knee and is s/p elective R TKA. ?  ?Clinical Impression ? Pt was pleasant and motivated to participate during the session and put forth good effort throughout. Pt required no physical assistance during the session and reported only mild R knee pain that did not worsen with activity.  Pt was steady with both transfers and gait with cuing for proper sequencing with SpO2 and HR WNL on room air.  Pt demonstrated very good R knee AROM at 0-95 deg despite being inhibited by wrapping.  Pt is expected to make very good progress while in acute care and will benefit from HHPT upon discharge to safely address deficits listed in patient problem list for decreased caregiver assistance and eventual return to PLOF. ? ?   ?   ? ?Recommendations for follow up therapy are one component of a multi-disciplinary discharge planning process, led by the attending physician.  Recommendations may be updated based on patient status, additional functional criteria and insurance authorization. ? ?Follow Up Recommendations Home health PT ? ?  ?Assistance Recommended at Discharge Intermittent Supervision/Assistance  ?Patient can return home with the following ? A little help with walking and/or transfers;A little help with bathing/dressing/bathroom;Assistance with cooking/housework;Assist for transportation;Help with stairs or ramp for entrance ? ?  ?Equipment Recommendations Rolling walker (2 wheels);BSC/3in1  ?Recommendations for Other Services ?    ?  ?Functional Status Assessment Patient has had a recent decline in their functional status and demonstrates the ability to make significant improvements in function in a reasonable and predictable amount of time.  ? ?  ?Precautions / Restrictions  Precautions ?Precautions: Fall ?Restrictions ?Weight Bearing Restrictions: Yes ?RLE Weight Bearing: Weight bearing as tolerated ?Other Position/Activity Restrictions: Pt able to perform Ind RLE SLR without extensor lag, no KI required  ? ?  ? ?Mobility ? Bed Mobility ?Overal bed mobility: Modified Independent ?  ?  ?  ?  ?  ?  ?General bed mobility comments: Min extra time and effort only ?  ? ?Transfers ?Overall transfer level: Needs assistance ?Equipment used: Rolling walker (2 wheels) ?Transfers: Sit to/from Stand ?Sit to Stand: Min guard ?  ?  ?  ?  ?  ?General transfer comment: Min verbal and visual cues for sequencing ?  ? ?Ambulation/Gait ?Ambulation/Gait assistance: Min guard ?Gait Distance (Feet): 3 Feet ?Assistive device: Rolling walker (2 wheels) ?Gait Pattern/deviations: Step-to pattern, Decreased stance time - right, Antalgic ?Gait velocity: decreased ?  ?  ?General Gait Details: Pt able to take several steps at the EOB and from bed to chair without LOB or R knee buckling ? ?Stairs ?  ?  ?  ?  ?  ? ?Wheelchair Mobility ?  ? ?Modified Rankin (Stroke Patients Only) ?  ? ?  ? ?Balance Overall balance assessment: Needs assistance ?  ?Sitting balance-Leahy Scale: Normal ?  ?  ?Standing balance support: Bilateral upper extremity supported, During functional activity ?Standing balance-Leahy Scale: Good ?  ?  ?  ?  ?  ?  ?  ?  ?  ?  ?  ?  ?   ? ? ? ?Pertinent Vitals/Pain Pain Assessment ?Pain Assessment: 0-10 ?Pain Score: 3  ?Pain Location: R knee ?Pain Descriptors / Indicators: Sore ?Pain Intervention(s): Repositioned, Ice applied, Premedicated before  session, Monitored during session  ? ? ?Home Living Family/patient expects to be discharged to:: Private residence ?Living Arrangements: Spouse/significant other ?Available Help at Discharge: Family;Available 24 hours/day ?Type of Home: House ?Home Access: Stairs to enter ?Entrance Stairs-Rails: Right ?Entrance Stairs-Number of Steps: 4 ?  ?Home Layout: Two  level;Able to live on main level with bedroom/bathroom ?Home Equipment: Shower seat ?   ?  ?Prior Function Prior Level of Function : Independent/Modified Independent ?  ?  ?  ?  ?  ?  ?Mobility Comments: Ind amb community distances without an AD, no fall history ?ADLs Comments: Ind with ADLs ?  ? ? ?Hand Dominance  ?   ? ?  ?Extremity/Trunk Assessment  ? Upper Extremity Assessment ?Upper Extremity Assessment: Overall WFL for tasks assessed ?  ? ?Lower Extremity Assessment ?Lower Extremity Assessment: RLE deficits/detail ?RLE Deficits / Details: BLE ankle AROM, strength, and sensation to light touch grossly WNL; RLE hip flexion strength >/= 3/5 ?RLE: Unable to fully assess due to pain ?RLE Sensation: WNL ?RLE Coordination: WNL ?  ? ?   ?Communication  ? Communication: No difficulties  ?Cognition Arousal/Alertness: Awake/alert ?Behavior During Therapy: [facility] for tasks assessed/performed ?Overall Cognitive Status: Within Functional Limits for tasks assessed ?  ?  ?  ?  ?  ?  ?  ?  ?  ?  ?  ?  ?  ?  ?  ?  ?  ?  ?  ? ?  ?General Comments   ? ?  ?Exercises Total Joint Exercises ?Ankle Circles/Pumps: AROM, Strengthening, Both, 10 reps ?Quad Sets: AROM, Strengthening, Right, 5 reps, 10 reps ?Straight Leg Raises: AROM, Strengthening, Right, 5 reps ?Long Arc Quad: AROM, Strengthening, Right, 5 reps, 10 reps ?Knee Flexion: AROM, Strengthening, Right, 5 reps, 10 reps ?Goniometric ROM: R knee AROM: 0-95 deg ?Marching in Standing: AROM, Strengthening, Both, 5 reps, Standing ?Other Exercises ?Other Exercises: Positioning education to promote R knee ext PROM and to protect heel from pressure ?Other Exercises: HEP education per handout ?Other Exercises: 90 deg R turn education to prevent CKC twisting on the R knee  ? ?Assessment/Plan  ?  ?PT Assessment Patient needs continued PT services  ?PT Problem List Decreased strength;Decreased range of motion;Decreased activity tolerance;Decreased balance;Decreased mobility;Decreased knowledge  of use of DME;Pain ? ?   ?  ?PT Treatment Interventions DME instruction;Gait training;Functional mobility training;Stair training;Therapeutic activities;Therapeutic exercise;Balance training;Patient/family education   ? ?PT Goals (Current goals can be found in the Care Plan section)  ?Acute Rehab PT Goals ?Patient Stated Goal: To be able to work outside ?PT Goal Formulation: With patient ?Time For Goal Achievement: 02/01/22 ?Potential to Achieve Goals: Good ? ?  ?Frequency BID ?  ? ? ?Co-evaluation   ?  ?  ?  ?  ? ? ?  ?AM-PAC PT "6 Clicks" Mobility  ?Outcome Measure Help needed turning from your back to your side while in a flat bed without using bedrails?: A Little ?Help needed moving from lying on your back to sitting on the side of a flat bed without using bedrails?: A Little ?Help needed moving to and from a bed to a chair (including a wheelchair)?: A Little ?Help needed standing up from a chair using your arms (e.g., wheelchair or bedside chair)?: A Little ?Help needed to walk in hospital room?: A Little ?Help needed climbing 3-5 steps with a railing? : A Little ?6 Click Score: 18 ? ?  ?End of Session Equipment Utilized During Treatment: Gait belt ?Activity  Tolerance: Patient tolerated treatment well;No increased pain ?Patient left: in chair;with call bell/phone within reach;with chair alarm set;with family/visitor present;with SCD's reapplied;Other (comment) (polar care donned to R knee) ?Nurse Communication: Mobility status ?PT Visit Diagnosis: Other abnormalities of gait and mobility (R26.89);Muscle weakness (generalized) (M62.81);Pain ?Pain - Right/Left: Right ?Pain - part of body: Knee ?  ? ?Time: [id number] ?PT Time Calculation (min) (ACUTE ONLY): 37 min ? ? ?Charges:   PT Evaluation ?$PT Eval Moderate Complexity: 1 Mod ?PT Treatments ?$Therapeutic Exercise: 8-22 mins ?$Therapeutic Activity: 8-22 mins ?  ?   ? ?D. Royetta Asal PT, DPT ?01/19/22, 5:25 PM ? ?

## 2022-01-19 NOTE — Op Note (Signed)
OPERATIVE NOTE ? ?DATE OF SURGERY:  01/19/2022 ? ?PATIENT NAME:  KHANH TANORI   ?DOB: 02-28-1956  ?MRN: [id number] ? ?PRE-OPERATIVE DIAGNOSIS: Degenerative arthrosis of the right knee, primary ? ?POST-OPERATIVE DIAGNOSIS:  Same ? ?PROCEDURE:  Right total knee arthroplasty using computer-assisted navigation ? ?SURGEON:  Jena Gauss. M.D. ? ?ASSISTANT: Baldwin Jamaica, PA-C (present and scrubbed throughout the case, critical for assistance with exposure, retraction, instrumentation, and closure) ? ?ANESTHESIA: spinal ? ?ESTIMATED BLOOD LOSS: 50 mL ? ?FLUIDS REPLACED: 1700 mL of crystalloid ? ?TOURNIQUET TIME: 96 minutes ? ?DRAINS: 2 medium Hemovac drains ? ?SOFT TISSUE RELEASES: Anterior cruciate ligament, posterior cruciate ligament, deep and superficial medial collateral ligament, patellofemoral ligament ? ?IMPLANTS UTILIZED: DePuy Attune size 6 posterior stabilized femoral component (cemented), size 7 rotating platform tibial component (cemented), 38 mm medialized dome patella (cemented), and a 5 mm stabilized rotating platform polyethylene insert. ? ?INDICATIONS FOR SURGERY: STEPHENS SHREVE is a 66 y.o. year old male with a long history of progressive knee pain. X-rays demonstrated severe degenerative changes in tricompartmental fashion. The patient had not seen any significant improvement despite conservative nonsurgical intervention. After discussion of the risks and benefits of surgical intervention, the patient expressed understanding of the risks benefits and agree with plans for total knee arthroplasty.  ? ?The risks, benefits, and alternatives were discussed at length including but not limited to the risks of infection, bleeding, nerve injury, stiffness, blood clots, the need for revision surgery, cardiopulmonary complications, among others, and they were willing to proceed. ? ?PROCEDURE IN DETAIL: The patient was brought into the operating room and, after adequate spinal anesthesia was achieved, a  tourniquet was placed on the patient's upper thigh. The patient's knee and leg were cleaned and prepped with alcohol and DuraPrep and draped in the usual sterile fashion. A "timeout" was performed as per usual protocol. The lower extremity was exsanguinated using an Esmarch, and the tourniquet was inflated to 300 mmHg. An anterior longitudinal incision was made followed by a standard mid vastus approach. The deep fibers of the medial collateral ligament were elevated in a subperiosteal fashion off of the medial flare of the tibia so as to maintain a continuous soft tissue sleeve. The patella was subluxed laterally and the patellofemoral ligament was incised. Inspection of the knee demonstrated severe degenerative changes with full-thickness loss of articular cartilage. Osteophytes were debrided using a rongeur. Anterior and posterior cruciate ligaments were excised. Two 4.0 mm Schanz pins were inserted in the femur and into the tibia for attachment of the array of trackers used for computer-assisted navigation. Hip center was identified using a circumduction technique. Distal landmarks were mapped using the computer. The distal femur and proximal tibia were mapped using the computer. The distal femoral cutting guide was positioned using computer-assisted navigation so as to achieve a 5? distal valgus cut. The femur was sized and it was felt that a size 6 femoral component was appropriate. A size 6 femoral cutting guide was positioned and the anterior cut was performed and verified using the computer. This was followed by completion of the posterior and chamfer cuts. Femoral cutting guide for the central box was then positioned in the center box cut was performed. ? ?Attention was then directed to the proximal tibia. Medial and lateral menisci were excised. The extramedullary tibial cutting guide was positioned using computer-assisted navigation so as to achieve a 0? varus-valgus alignment and 3? posterior slope. The  cut was performed and verified using the computer. The proximal  tibia was sized and it was felt that a size 7 tibial tray was appropriate. Tibial and femoral trials were inserted followed by insertion of a 5 mm polyethylene insert. The knee was felt to be tight medially. A Cobb elevator was used to elevate the superficial fibers of the medial collateral ligament.  This allowed for excellent mediolateral soft tissue balancing both in flexion and in full extension. Finally, the patella was cut and prepared so as to accommodate a 38 mm medialized dome patella. A patella trial was placed and the knee was placed through a range of motion with excellent patellar tracking appreciated. The femoral trial was removed after debridement of posterior osteophytes. The central post-hole for the tibial component was reamed followed by insertion of a keel punch. Tibial trials were then removed. Cut surfaces of bone were irrigated with copious amounts of normal saline using pulsatile lavage and then suctioned dry. Polymethylmethacrylate cement with gentamicin was prepared in the usual fashion using a vacuum mixer. Cement was applied to the cut surface of the proximal tibia as well as along the undersurface of a size 7 rotating platform tibial component. Tibial component was positioned and impacted into place. Excess cement was removed using Personal assistant. Cement was then applied to the cut surfaces of the femur as well as along the posterior flanges of the size 6 femoral component. The femoral component was positioned and impacted into place. Excess cement was removed using Personal assistant. A 5 mm polyethylene trial was inserted and the knee was brought into full extension with steady axial compression applied. Finally, cement was applied to the backside of a 38 mm medialized dome patella and the patellar component was positioned and patellar clamp applied. Excess cement was removed using Personal assistant. After adequate curing of  the cement, the tourniquet was deflated after a total tourniquet time of 96 minutes. Hemostasis was achieved using electrocautery. The knee was irrigated with copious amounts of normal saline using pulsatile lavage followed by 350 ml of Prontosan and then suctioned dry. 20 mL of 1.3% Exparel and 60 mL of 0.25% Marcaine in 40 mL of normal saline was injected along the posterior capsule, medial and lateral gutters, and along the arthrotomy site. A 5 mm stabilized rotating platform polyethylene insert was inserted and the knee was placed through a range of motion with excellent mediolateral soft tissue balancing appreciated and excellent patellar tracking noted. 2 medium drains were placed in the wound bed and brought out through separate stab incisions. The medial parapatellar portion of the incision was reapproximated using interrupted sutures of #1 Vicryl. Subcutaneous tissue was approximated in layers using first #0 Vicryl followed #2-0 Vicryl. The skin was approximated with skin staples. A sterile dressing was applied. ? ?The patient tolerated the procedure well and was transported to the recovery room in stable condition.   ? ?Jillann Charette P. Angie Fava., M.D.  ?

## 2022-01-19 NOTE — Anesthesia Procedure Notes (Signed)
Spinal ? ?Patient location during procedure: OR ?Start time: 01/19/2022 11:24 AM ?End time: 01/19/2022 11:26 AM ?Reason for block: surgical anesthesia ?Staffing ?Performed: resident/CRNA  ?Preanesthetic Checklist ?Completed: patient identified, IV checked, site marked, risks and benefits discussed, surgical consent, monitors and equipment checked, pre-op evaluation and timeout performed ?Spinal Block ?Patient position: sitting ?Prep: DuraPrep ?Patient monitoring: heart rate, cardiac monitor, continuous pulse ox and blood pressure ?Approach: midline ?Location: L3-4 ?Injection technique: single-shot ?Needle ?Needle type: Pencan  ?Needle gauge: 24 G ?Needle length: 9 cm ?Assessment ?Sensory level: T10 ?Events: CSF return ? ? ? ?

## 2022-01-20 ENCOUNTER — Encounter: Payer: Self-pay | Admitting: Orthopedic Surgery

## 2022-01-20 DIAGNOSIS — M1711 Unilateral primary osteoarthritis, right knee: Secondary | ICD-10-CM | POA: Diagnosis not present

## 2022-01-20 MED ORDER — OXYCODONE HCL 5 MG PO TABS: 5.0000 mg | ORAL_TABLET | ORAL | 0 refills | Status: DC | PRN

## 2022-01-20 MED ORDER — ENOXAPARIN SODIUM 40 MG/0.4ML IJ SOSY: 40.0000 mg | PREFILLED_SYRINGE | INTRAMUSCULAR | 0 refills | Status: DC

## 2022-01-20 MED ORDER — CELECOXIB 200 MG PO CAPS: 200.0000 mg | ORAL_CAPSULE | Freq: Two times a day (BID) | ORAL | 0 refills | Status: DC

## 2022-01-20 MED ORDER — TRAMADOL HCL 50 MG PO TABS: 50.0000 mg | ORAL_TABLET | ORAL | 0 refills | Status: DC | PRN

## 2022-01-20 NOTE — Plan of Care (Signed)
?  Problem: Health Behavior/Discharge Planning: ?Goal: Ability to manage health-related needs will improve ?Outcome: Progressing ?  ?Problem: Clinical Measurements: ?Goal: Ability to maintain clinical measurements within normal limits will improve ?Outcome: Progressing ?  ?Problem: Activity: ?Goal: Ability to avoid complications of mobility impairment will improve ?Outcome: Progressing ?Goal: Range of joint motion will improve ?Outcome: Progressing ?  ?Problem: Clinical Measurements: ?Goal: Postoperative complications will be avoided or minimized ?Outcome: Progressing ?  ?Problem: Pain Management: ?Goal: Pain level will decrease with appropriate interventions ?Outcome: Progressing ?  ?

## 2022-01-20 NOTE — Progress Notes (Signed)
?  Subjective: ?1 Day Post-Op Procedure(s) (LRB): ?COMPUTER ASSISTED TOTAL KNEE ARTHROPLASTY (Right) ?Patient reports pain as well-controlled.   ?Patient is well, and has had no acute complaints or problems ?Plan is to go Home after hospital stay. ?Negative for chest pain and shortness of breath ?Fever: no ?Gastrointestinal: negative for nausea and vomiting.  Patient has not had a bowel movement. ? ?Objective: ?Vital signs in last 24 hours: ?Temp:  [97.2 ?F (36.2 ?C)-98.6 ?F (37 ?C)] 98.6 ?F (37 ?C) (03/21 0816) ?Pulse Rate:  [62-88] 62 (03/21 0816) ?Resp:  [14-18] 18 (03/21 0816) ?BP: (118-149)/(58-93) 127/81 (03/21 0816) ?SpO2:  [94 %-98 %] 97 % (03/21 0816) ?Weight:  [720 kg] 108 kg (03/20 1004) ? ?Intake/Output from previous day: ? ?Intake/Output Summary (Last 24 hours) at 01/20/2022 0823 ?Last data filed at 01/19/2022 2106 ?Gross per 24 hour  ?Intake 1900 ml  ?Output 430 ml  ?Net 1470 ml  ?  ?Intake/Output this shift: ?No intake/output data recorded. ? ?Labs: ?No results for input(s): HGB in the last 72 hours. ?No results for input(s): WBC, RBC, HCT, PLT in the last 72 hours. ?No results for input(s): NA, K, CL, CO2, BUN, CREATININE, GLUCOSE, CALCIUM in the last 72 hours. ?No results for input(s): LABPT, INR in the last 72 hours. ? ? ?EXAM ?General - Patient is Alert, Appropriate, and Oriented ?Extremity - Neurovascular intact ?Dorsiflexion/Plantar flexion intact ?Compartment soft ?Dressing/Incision -Postoperative dressing remains in place., Polar Care in place and working. , Hemovac in place. , Following removal of post-op dressing, no drainage noted. ?Motor Function - intact, moving foot and toes well on exam. Able to perform independent SLR.  ?Cardiovascular- Regular rate and rhythm, no murmurs/rubs/gallops ?Respiratory- Lungs clear to auscultation bilaterally ?Gastrointestinal- soft, nontender, and active bowel sounds ? ? ?Assessment/Plan: ?1 Day Post-Op Procedure(s) (LRB): ?COMPUTER ASSISTED TOTAL KNEE  ARTHROPLASTY (Right) ?Principal Problem: ?  Total knee replacement status ? ?Estimated body mass index is 37.28 kg/m? as calculated from the following: ?  Height as of this encounter: 5\' 7"  (1.702 m). ?  Weight as of this encounter: 108 kg. ?Advance diet ?Up with therapy ? ?Likely d/c today after AM therapy.  ? ?Post-op dressing removed. , Hemovac removed., and Mini compression dressing applied.  ? ?DVT Prophylaxis - Lovenox, Ted hose, and SCDs ?Weight-Bearing as tolerated to right leg ? ? , PA-C ?[facility] ?01/20/2022, 8:23 AM ? ?

## 2022-01-20 NOTE — Progress Notes (Signed)
Patient and wife educated on Lovenox injections and proper administration, side effects,and purpose. Patient and wife verbalized understanding with teachback.  ?

## 2022-01-20 NOTE — TOC Progression Note (Signed)
Transition of Care (TOC) - Progression Note  ? ? ?Patient Details  ?Name: Donald Kennedy ?MRN: [id number] ?Date of Birth: Sep 18, 1956 ? ?Transition of Care (TOC) CM/SW Contact  ?Marlowe Sax, RN ?Phone Number: ?01/20/2022, 9:04 AM ? ?Clinical Narrative:   The patient is set up with [facility] from the surgeons office prior to DC, he will need a rw and a 3 in 1, [employer] will deliver to the room prior to DC ? ? ? ?  ?  ? ?Expected Discharge Plan and Services ?  ?  ?  ?  ?  ?                ?  ?  ?  ?  ?  ?  ?  ?  ?  ?  ? ? ?Social Determinants of Health (SDOH) Interventions ?  ? ?Readmission Risk Interventions ?No flowsheet data found. ? ?

## 2022-01-20 NOTE — Progress Notes (Cosign Needed)
Patient is not able to walk the distance required to go the bathroom,  and is unable to go up stairs to use the bathroom, A 3 in 1 Will eleviate this problem.  ?

## 2022-01-20 NOTE — Progress Notes (Signed)
Physical Therapy Treatment ?Patient Details ?Name: Donald Kennedy ?MRN: [id number] ?DOB: June 29, 1956 ?Today's Date: 01/20/2022 ? ? ?History of Present Illness Pt is a 66 yo M with a PMH of HTN who was diagnosed with degenerative arthrosis of the right knee and is s/p elective R TKA. ? ?  ?PT Comments  ? ? Pt was pleasant and motivated to participate during the session and put forth good effort throughout. Pt required no physical assistance during the session and demonstrated good control and stability with transfers, gait, and stair training.  Pt was able to amb 2 x 150' with a RW with good cadence and with only minimally antalgic step-through pattern.  Pt reported no adverse symptoms during the session other than mild to moderate R knee pain with SpO2 and HR WNL on room air.  Pt will benefit from HHPT upon discharge to safely address deficits listed in patient problem list for decreased caregiver assistance and eventual return to PLOF. ? ?   ?Recommendations for follow up therapy are one component of a multi-disciplinary discharge planning process, led by the attending physician.  Recommendations may be updated based on patient status, additional functional criteria and insurance authorization. ? ?Follow Up Recommendations ? Home health PT ?  ?  ?Assistance Recommended at Discharge Intermittent Supervision/Assistance  ?Patient can return home with the following A little help with walking and/or transfers;A little help with bathing/dressing/bathroom;Assistance with cooking/housework;Assist for transportation;Help with stairs or ramp for entrance ?  ?Equipment Recommendations ? Rolling walker (2 wheels);BSC/3in1  ?  ?Recommendations for Other Services   ? ? ?  ?Precautions / Restrictions Precautions ?Precautions: Fall ?Restrictions ?Weight Bearing Restrictions: Yes ?RLE Weight Bearing: Weight bearing as tolerated ?Other Position/Activity Restrictions: Pt able to perform Ind RLE SLR without extensor lag, no KI required   ?  ? ?Mobility ? Bed Mobility ?  ?  ?  ?  ?  ?  ?  ?General bed mobility comments: NT, in recliner ?  ? ?Transfers ?Overall transfer level: Needs assistance ?Equipment used: Rolling walker (2 wheels) ?Transfers: Sit to/from Stand ?Sit to Stand: Supervision ?  ?  ?  ?  ?  ?General transfer comment: Min verbal and visual cues for sequencing ?  ? ?Ambulation/Gait ?Ambulation/Gait assistance: Min guard, Supervision ?Gait Distance (Feet): 150 Feet x 2 ?Assistive device: Rolling walker (2 wheels) ?Gait Pattern/deviations: Step-through pattern, Decreased step length - right, Decreased step length - left ?Gait velocity: decreased ?  ?  ?General Gait Details: Good cadence with step-through pattern with no instability or buckling noted ? ? ?Stairs:  ?Ascend/descend 4 steps x 2 forwards with step-to pattern with R rail with good eccentric and concentric control and stability with spouse present for training  ?  ?  ?  ?  ?  ? ? ?Wheelchair Mobility ?  ? ?Modified Rankin (Stroke Patients Only) ?  ? ? ?  ?Balance Overall balance assessment: Needs assistance ?  ?Sitting balance-Leahy Scale: Normal ?  ?  ?Standing balance support: Bilateral upper extremity supported, During functional activity ?Standing balance-Leahy Scale: Good ?  ?  ?  ?  ?  ?  ?  ?  ?  ?  ?  ?  ?  ? ?  ?Cognition Arousal/Alertness: Awake/alert ?Behavior During Therapy: [facility] for tasks assessed/performed ?Overall Cognitive Status: Within Functional Limits for tasks assessed ?  ?  ?  ?  ?  ?  ?  ?  ?  ?  ?  ?  ?  ?  ?  ?  ?  ?  ?  ? ?  ?  Exercises Total Joint Exercises ?Ankle Circles/Pumps: AROM, Strengthening, Both, 10 reps ?Quad Sets: AROM, Strengthening, Right, 5 reps, 10 reps ?Long Arc Quad: AROM, Strengthening, Right, 5 reps, 10 reps ?Knee Flexion: AROM, Strengthening, Right, 5 reps, 10 reps ?Goniometric ROM: R knee AROM: 0-108 deg ?Other Exercises ?Other Exercises: Car transfer sequencing education ?Other Exercises: HEP education/review per handout ? ?   ?General Comments   ?  ?  ? ?Pertinent Vitals/Pain Pain Assessment ?Pain Assessment: 0-10 ?Pain Score: 4  ?Pain Location: R knee ?Pain Descriptors / Indicators: Sore ?Pain Intervention(s): Repositioned, Premedicated before session, Monitored during session, Ice applied  ? ? ?Home Living   ?  ?  ?  ?  ?  ?  ?  ?  ?  ?   ?  ?Prior Function    ?  ?  ?   ? ?PT Goals (current goals can now be found in the care plan section) Progress towards PT goals: Progressing toward goals ? ?  ?Frequency ? ? ? BID ? ? ? ?  ?PT Plan Current plan remains appropriate  ? ? ?Co-evaluation   ?  ?  ?  ?  ? ?  ?AM-PAC PT "6 Clicks" Mobility   ?Outcome Measure ? Help needed turning from your back to your side while in a flat bed without using bedrails?: A Little ?Help needed moving from lying on your back to sitting on the side of a flat bed without using bedrails?: A Little ?Help needed moving to and from a bed to a chair (including a wheelchair)?: A Little ?Help needed standing up from a chair using your arms (e.g., wheelchair or bedside chair)?: A Little ?Help needed to walk in hospital room?: A Little ?Help needed climbing 3-5 steps with a railing? : A Little ?6 Click Score: 18 ? ?  ?End of Session Equipment Utilized During Treatment: Gait belt ?Activity Tolerance: Patient tolerated treatment well ?Patient left: in chair;with call bell/phone within reach;with chair alarm set;with family/visitor present;with SCD's reapplied;Other (comment) (polar care donned to R knee) ?Nurse Communication: Mobility status ?PT Visit Diagnosis: Other abnormalities of gait and mobility (R26.89);Muscle weakness (generalized) (M62.81);Pain ?Pain - Right/Left: Right ?Pain - part of body: Knee ?  ? ? ?Time: 1000-1029 ?PT Time Calculation (min) (ACUTE ONLY): 29 min ? ?Charges:  $Gait Training: 8-22 mins ?$Therapeutic Activity: 8-22 mins          ?          ?D. Elly Modena PT, DPT ?01/20/22, 10:46 AM ? ? ? ? ?

## 2022-01-20 NOTE — Evaluation (Signed)
Occupational Therapy Evaluation ?Patient Details ?Name: Donald Kennedy ?MRN: [id number] ?DOB: 1956-02-18 ?Today's Date: 01/20/2022 ? ? ?History of Present Illness Pt is a 66 yo M with a PMH of HTN who was diagnosed with degenerative arthrosis of the right knee and is s/p elective R TKA.  ? ?Clinical Impression ?  ?Pt seen for OT evaluation this date, POD#1 from above surgery. Pt was modified independent in all ADL prior to surgery, using SPC for mobility and unable to stand for long periods of time due to R knee pain. Pt is eager to return to PLOF with less pain and improved safety and independence. Pt currently requires PRN minimal assist for LB dressing and bathing while in seated position due to pain and limited AROM of R knee. Pt/spouse instructed in polar care mgt, falls prevention strategies, home/routines modifications, car transfers, DME/AE for LB bathing and dressing tasks, and compression stocking mgt. Handout provided to support recall and carryover. Pt/spouse verbalized understanding and denied additional needs. Do not currently anticipate any OT needs following this hospitalization.     ? ?Recommendations for follow up therapy are one component of a multi-disciplinary discharge planning process, led by the attending physician.  Recommendations may be updated based on patient status, additional functional criteria and insurance authorization.  ? ?Follow Up Recommendations ? No OT follow up  ?  ?Assistance Recommended at Discharge PRN  ?Patient can return home with the following A little help with bathing/dressing/bathroom;Assist for transportation;Assistance with cooking/housework ? ?  ?Functional Status Assessment ? Patient has had a recent decline in their functional status and demonstrates the ability to make significant improvements in function in a reasonable and predictable amount of time.  ?Equipment Recommendations ? BSC/3in1  ?  ?Recommendations for Other Services   ? ? ?  ?Precautions /  Restrictions Precautions ?Precautions: Fall ?Restrictions ?Weight Bearing Restrictions: Yes ?RLE Weight Bearing: Weight bearing as tolerated ?Other Position/Activity Restrictions: Pt able to perform Ind RLE SLR without extensor lag, no KI required  ? ?  ? ?Mobility Bed Mobility ?  ?  ?  ?  ?  ?  ?  ?General bed mobility comments: NT, in recliner ?  ? ?Transfers ?  ?  ?  ?  ?  ?  ?  ?  ?  ?  ?  ? ?  ?Balance   ?  ?  ?  ?  ?  ?  ?  ?  ?  ?  ?  ?  ?  ?  ?  ?  ?  ?  ?   ? ?ADL either performed or assessed with clinical judgement  ? ?ADL   ?  ?  ?  ?  ?  ?  ?  ?  ?  ?  ?  ?  ?  ?  ?  ?  ?  ?  ?  ?General ADL Comments: Pt requires PRN MIN A for LB ADL tasks, spouse able to assist as needed  ? ? ? ?Vision   ?   ?   ?Perception   ?  ?Praxis   ?  ? ?Pertinent Vitals/Pain Pain Assessment ?Pain Assessment: 0-10 ?Pain Score: 4  ?Pain Location: R knee ?Pain Descriptors / Indicators: Sore ?Pain Intervention(s): Limited activity within patient's tolerance, Monitored during session, Premedicated before session, Repositioned, Ice applied  ? ? ? ?Hand Dominance   ?  ?Extremity/Trunk Assessment Upper Extremity Assessment ?Upper Extremity Assessment: Overall WFL for tasks assessed ?  ?Lower Extremity  Assessment ?Lower Extremity Assessment: RLE deficits/detail ?RLE Deficits / Details: post-op TKA expected strength/ROM deficits ?  ?  ?  ?Communication Communication ?Communication: No difficulties ?  ?Cognition Arousal/Alertness: Awake/alert ?Behavior During Therapy: [facility] for tasks assessed/performed ?Overall Cognitive Status: Within Functional Limits for tasks assessed ?  ?  ?  ?  ?  ?  ?  ?  ?  ?  ?  ?  ?  ?  ?  ?  ?  ?  ?  ?General Comments    ? ?  ?Exercises Other Exercises ?Other Exercises: Pt/spouse instructed in home/routines modifications, falls prevention, AE/DME, compresison stocking mgt, car transfers, and polar care mgt ?  ?Shoulder Instructions    ? ? ?Home Living Family/patient expects to be discharged to:: Private  residence ?Living Arrangements: Spouse/significant other ?Available Help at Discharge: Family;Available 24 hours/day ?Type of Home: House ?Home Access: Stairs to enter ?Entrance Stairs-Number of Steps: 4 ?Entrance Stairs-Rails: Right ?Home Layout: Two level;Able to live on main level with bedroom/bathroom ?  ?  ?Bathroom Shower/Tub: Walk-in shower ?  ?Bathroom Toilet: Handicapped height ?  ?  ?Home Equipment: Shower seat ?  ?  ?  ? ?  ?Prior Functioning/Environment Prior Level of Function : Independent/Modified Independent ?  ?  ?  ?  ?  ?  ?Mobility Comments: Ind amb community distances without an AD, no fall history ?ADLs Comments: Ind with ADLs ?  ? ?  ?  ?OT Problem List: Decreased strength;Decreased range of motion;Pain ?  ?   ?OT Treatment/Interventions:    ?  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  ? ?Co-evaluation   ?  ?  ?  ?  ? ?  ?AM-PAC OT "6 Clicks" Daily Activity     ?Outcome Measure Help from another person eating meals?: None ?Help from another person taking care of personal grooming?: None ?Help from another person toileting, which includes using toliet, bedpan, or urinal?: None ?Help from another person bathing (including washing, rinsing, drying)?: A Little ?Help from another person to put on and taking off regular upper body clothing?: None ?Help from another person to put on and taking off regular lower body clothing?: A Little ?6 Click Score: 22 ?  ?End of Session   ? ?Activity Tolerance: Patient tolerated treatment well ?Patient left: in chair;with call bell/phone within reach;with family/visitor present;with nursing/sitter in room ? ?OT Visit Diagnosis: Other abnormalities of gait and mobility (R26.89)  ?              ?Time: 5102-5852 ?OT Time Calculation (min): 29 min ?Charges:  OT General Charges ?$OT  Visit: 1 Visit ?OT Evaluation ?$OT Eval Moderate Complexity: 1 Mod ?OT Treatments ?$Self Care/Home Management : 8-22 mins ? ?Arman Filter., MPH, MS, OTR/L ?ascom [phone] ?01/20/22, 12:31 PM ?

## 2022-01-20 NOTE — Discharge Summary (Signed)
Physician Discharge Summary  ?Patient ID: ?Donald Kennedy ?MRN: [id number] ?DOB/AGE: Jul 28, 1956 66 y.o. ? ?Admit date: 01/19/2022 ?Discharge date: 01/20/2022 ? ?Admission Diagnoses:  ?Total knee replacement status [Z96.659] ? ?Surgeries:Procedure(s): ? Right total knee arthroplasty using computer-assisted navigation ?  ?SURGEON:  Marciano Sequin. M.D. ?  ?ASSISTANT: Cassell Smiles, PA-C (present and scrubbed throughout the case, critical for assistance with exposure, retraction, instrumentation, and closure) ?  ?ANESTHESIA: spinal ?  ?ESTIMATED BLOOD LOSS: 50 mL ?  ?FLUIDS REPLACED: 1700 mL of crystalloid ?  ?TOURNIQUET TIME: 96 minutes ?  ?DRAINS: 2 medium Hemovac drains ?  ?SOFT TISSUE RELEASES: Anterior cruciate ligament, posterior cruciate ligament, deep and superficial medial collateral ligament, patellofemoral ligament ?  ?IMPLANTS UTILIZED: DePuy Attune size 6 posterior stabilized femoral component (cemented), size 7 rotating platform tibial component (cemented), 38 mm medialized dome patella (cemented), and a 5 mm stabilized rotating platform polyethylene insert. ? ?Discharge Diagnoses: ?Patient Active Problem List  ? Diagnosis Date Noted  ? Total knee replacement status 01/19/2022  ? Primary osteoarthritis of left knee 10/19/2021  ? Primary osteoarthritis of right knee 10/19/2021  ? Hyperlipidemia 11/20/2020  ? OA (osteoarthritis) 11/20/2020  ? BPH associated with nocturia 08/05/2020  ? Essential hypertension 08/05/2020  ? 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 ? ?Past Medical History:  ?Diagnosis Date  ? Asthma   ? as a child  ? BPH (benign prostatic hyperplasia)   ? Carpal tunnel syndrome   ? Hyperlipidemia   ? Hypertension 2019  ? only took medication for a short time; no longer on medication (wants removed from record)  ? Osteoarthritis   ? ?  ?Transfusion:  ?  ?Consultants (if any):  ? ?Discharged Condition: Improved ? ?Hospital Course: Donald Kennedy is an 66 y.o. male who was admitted  01/19/2022 with a diagnosis of right knee osteoarthritis and went to the operating room on 01/19/2022 and underwent right total knee arthroplasty. The patient received perioperative antibiotics for prophylaxis (see below). The patient tolerated the procedure well and was transported to PACU in stable condition. After meeting PACU criteria, the patient was subsequently transferred to the Orthopaedics/Rehabilitation unit.  ? ?The patient received DVT prophylaxis in the form of early mobilization, Lovenox, TED hose, and SCDs . A sacral pad had been placed and heels were elevated off of the bed with rolled towels in order to protect skin integrity. Foley catheter was discontinued on postoperative day #0. Wound drains were discontinued on postoperative day #1. The surgical incision was healing well without signs of infection. ? ?Physical therapy was initiated postoperatively for transfers, gait training, and strengthening. Occupational therapy was initiated for activities of daily living and evaluation for assisted devices. Rehabilitation goals were reviewed in detail with the patient. The patient made steady progress with physical therapy and physical therapy recommended discharge to Home.  ? ?The patient achieved the preliminary goals of this hospitalization and was felt to be medically and orthopaedically appropriate for discharge. ? ?He was given perioperative antibiotics:  ?Anti-infectives (From admission, onward)  ? ? Start     Dose/Rate Route Frequency Ordered Stop  ? 01/19/22 1730  ceFAZolin (ANCEF) IVPB 2g/100 mL premix       ? 2 g ?200 mL/hr over 30 Minutes Intravenous Every 6 hours 01/19/22 1516 01/19/22 2355  ? 01/19/22 0952  ceFAZolin (ANCEF) 2-4 GM/100ML-% IVPB       ?Note to Pharmacy: Jordan Hawks H: cabinet override  ?    01/19/22 [id number] 01/19/22 1807  ?  01/19/22 0600  ceFAZolin (ANCEF) IVPB 2g/100 mL premix       ? 2 g ?200 mL/hr over 30 Minutes Intravenous On call to O.R. 01/18/22 2221 01/19/22 1120   ? ?  ?. ? ?Recent vital signs:  ?Vitals:  ? 01/20/22 0333 01/20/22 0816  ?BP: 120/75 127/81  ?Pulse: 62 62  ?Resp: 14 18  ?Temp: 97.9 ?F (36.6 ?C) 98.6 ?F (37 ?C)  ?SpO2: 98% 97%  ? ? ?Recent laboratory studies:  ?No results for input(s): WBC, HGB, HCT, PLT, K, CL, CO2, BUN, CREATININE, GLUCOSE, CALCIUM, LABPT, INR in the last 72 hours. ? ?Diagnostic Studies: DG Knee Right Port ? ?Result Date: 01/19/2022 ?CLINICAL DATA:  Status post right total knee replacement. EXAM: PORTABLE RIGHT KNEE - 1-2 VIEW COMPARISON:  None. FINDINGS: Right total knee arthroplasty. Knee is located without a periprosthetic fracture. Surgical drain is present. Skin staples are noted. Expected soft tissue changes. IMPRESSION: Right knee arthroplasty with expected postoperative changes. Electronically Signed   By: Markus Daft M.D.   On: 01/19/2022 15:18   ? ?Discharge Medications:   ?Allergies as of 01/20/2022   ?No Known Allergies ?  ? ?  ?Medication List  ?  ? ?STOP taking these medications   ? ?Advil Dual Action 125-250 MG Tabs ?Generic drug: Ibuprofen-Acetaminophen ?  ? ?  ? ?TAKE these medications   ? ?carboxymethylcellul-glycerin 0.5-0.9 % ophthalmic solution ?Commonly known as: Elliott ?Apply 1-2 drops to eye as needed for dry eyes. ?  ?celecoxib 200 MG capsule ?Commonly known as: CELEBREX ?Take 1 capsule (200 mg total) by mouth 2 (two) times daily. ?  ?cetirizine 10 MG tablet ?Commonly known as: ZYRTEC ?Take 10 mg by mouth at bedtime. ?  ?clobetasol cream 0.05 % ?Commonly known as: TEMOVATE ?Apply 1 application. topically 2 (two) times daily as needed. ?  ?enoxaparin 40 MG/0.4ML injection ?Commonly known as: LOVENOX ?Inject 0.4 mLs (40 mg total) into the skin daily for 14 days. ?  ?oxyCODONE 5 MG immediate release tablet ?Commonly known as: Oxy IR/ROXICODONE ?Take 1 tablet (5 mg total) by mouth every 4 (four) hours as needed for severe pain. ?  ?simvastatin 40 MG tablet ?Commonly known as: ZOCOR ?Take 40 mg by mouth at bedtime. ?   ?Systane Complete 0.6 % Soln ?Generic drug: Propylene Glycol ?Place 1 drop into both eyes daily as needed (dry eyes). ?  ?tadalafil 10 MG tablet ?Commonly known as: Cialis ?Take 1 tablet (10 mg total) by mouth daily as needed for erectile dysfunction. ?  ?tamsulosin 0.4 MG Caps capsule ?Commonly known as: FLOMAX ?Take 1 capsule (0.4 mg total) by mouth daily. ?What changed: when to take this ?  ?traMADol 50 MG tablet ?Commonly known as: ULTRAM ?Take 1 tablet (50 mg total) by mouth every 4 (four) hours as needed for moderate pain. ?  ?trolamine salicylate 10 % cream ?Commonly known as: ASPERCREME ?Apply 1 application topically as needed for muscle pain. ?  ?VITAMIN C-VITAMIN D-ZINC PO ?Take 2 tablets by mouth daily in the afternoon. Natures B[place] C/D Zinc gummies ?  ? ?  ? ?  ?  ? ? ?  ?Durable Medical Equipment  ?(From admission, onward)  ?  ? ? ?  ? ?  Start     Ordered  ? 01/19/22 1611  DME Walker rolling  Once       ?Question:  Patient needs a walker to treat with the following condition  Answer:  Total knee replacement status  ? 01/19/22  1610  ? 01/19/22 1611  DME Bedside commode  Once       ?Question:  Patient needs a bedside commode to treat with the following condition  Answer:  Total knee replacement status  ? 01/19/22 1610  ? ?  ?  ? ?  ? ? ?Disposition: Home with home health PT ? ? ? ? ? ? ?Tamala Julian, PA-C ?01/20/2022, 10:46 AM ? ? ? ? ? ?

## 2022-01-20 NOTE — Anesthesia Postprocedure Evaluation (Signed)
Anesthesia Post Note ? ?Patient: Donald Kennedy ? ?Procedure(s) Performed: COMPUTER ASSISTED TOTAL KNEE ARTHROPLASTY (Right: Knee) ? ?Patient location during evaluation: Nursing Unit ?Anesthesia Type: Spinal ?Level of consciousness: awake ?Pain management: pain level controlled ?Respiratory status: spontaneous breathing ?Cardiovascular status: stable ?Postop Assessment: no headache ?Anesthetic complications: no ? ? ?No notable events documented. ? ? ?Last Vitals:  ?Vitals:  ? 01/19/22 2333 01/20/22 0333  ?BP: (!) 124/58 120/75  ?Pulse: 68 62  ?Resp: 18 14  ?Temp: 36.9 ?C 36.6 ?C  ?SpO2: 95% 98%  ?  ?Last Pain:  ?Vitals:  ? 01/20/22 0659  ?TempSrc:   ?PainSc: 6   ? ? ?  ?  ?  ?  ?  ?  ? ?Jaye Beagle ? ? ? ? ?

## 2022-03-12 ENCOUNTER — Encounter: Payer: Self-pay | Admitting: Urology

## 2022-03-12 ENCOUNTER — Ambulatory Visit: Payer: Medicare PPO | Admitting: Urology

## 2022-03-12 DIAGNOSIS — Z125 Encounter for screening for malignant neoplasm of prostate: Secondary | ICD-10-CM | POA: Diagnosis not present

## 2022-03-12 DIAGNOSIS — N138 Other obstructive and reflux uropathy: Secondary | ICD-10-CM | POA: Diagnosis not present

## 2022-03-12 DIAGNOSIS — Z87440 Personal history of urinary (tract) infections: Secondary | ICD-10-CM | POA: Diagnosis not present

## 2022-03-12 DIAGNOSIS — N390 Urinary tract infection, site not specified: Secondary | ICD-10-CM

## 2022-03-12 DIAGNOSIS — N401 Enlarged prostate with lower urinary tract symptoms: Secondary | ICD-10-CM

## 2022-03-12 MED ORDER — TADALAFIL 10 MG PO TABS: 10.0000 mg | ORAL_TABLET | Freq: Every day | ORAL | 6 refills | Status: DC | PRN

## 2022-03-12 MED ORDER — TAMSULOSIN HCL 0.4 MG PO CAPS: 0.4000 mg | ORAL_CAPSULE | Freq: Every day | ORAL | 3 refills | Status: DC

## 2022-03-12 NOTE — Patient Instructions (Signed)
Prostate Cancer Screening ? ?Prostate cancer screening is testing that is done to check for the presence of prostate cancer in men. The prostate gland is a walnut-sized gland that is located below the bladder and in front of the rectum in males. The function of the prostate is to add fluid to semen during ejaculation. Prostate cancer is one of the most common types of cancer in men. ?Who should have prostate cancer screening? ?Screening recommendations vary based on age and other risk factors, as well as between the professional organizations who make the recommendations. ?In general, screening is recommended if: ?You are age 1 to 2 and have an average risk for prostate cancer. You should talk with your health care provider about your need for screening and how often screening should be done. Because most prostate cancers are slow growing and will not cause death, screening in this age group is generally reserved for men who have a 101- to 15-year life expectancy. ?You are younger than age 68, and you have these risk factors: ?Having a father, brother, or uncle who has been diagnosed with prostate cancer. The risk is higher if your family member's cancer occurred at an early age or if you have multiple family members with prostate cancer at an early age. ?Being a male who is [place] or is of [place] or sub-Saharan African descent. ?In general, screening is not recommended if: ?You are younger than age 68. ?You are between the ages of 68 and 50 and you have no risk factors. ?You are 81 years of age or older. At this age, the risks that screening can cause are greater than the benefits that it may provide. ?If you are at high risk for prostate cancer, your health care provider may recommend that you have screenings more often or that you start screening at a younger age. ?How is screening for prostate cancer done? ?The recommended prostate cancer screening test is a blood test called the prostate-specific antigen  (PSA) test. PSA is a protein that is made in the prostate. As you age, your prostate naturally produces more PSA. Abnormally high PSA levels may be caused by: ?Prostate cancer. ?An enlarged prostate that is not caused by cancer (benign prostatic hyperplasia, or BPH). This condition is very common in older men. ?A prostate gland infection (prostatitis) or urinary tract infection. ?Certain medicines such as male hormones (like testosterone) or other medicines that raise testosterone levels. ?A rectal exam may be done as part of prostate cancer screening to help provide information about the size of your prostate gland. When a rectal exam is performed, it should be done after the PSA level is drawn to avoid any effect on the results. ?Depending on the PSA results, you may need more tests, such as: ?A physical exam to check the size of your prostate gland, if not done as part of screening. ?Blood and imaging tests. ?A procedure to remove tissue samples from your prostate gland for testing (biopsy). This is the only way to know for certain if you have prostate cancer. ?What are the benefits of prostate cancer screening? ?Screening can help to identify cancer at an early stage, before symptoms start and when the cancer can be treated more easily. ?There is a small chance that screening may lower your risk of dying from prostate cancer. The chance is small because prostate cancer is a slow-growing cancer, and most men with prostate cancer die from a different cause. ?What are the risks of prostate cancer screening? ?The  main risk of prostate cancer screening is diagnosing and treating prostate cancer that would never have caused any symptoms or problems. This is called overdiagnosisand overtreatment. PSA screening cannot tell you if your PSA is high due to cancer or a different cause. A prostate biopsy is the only procedure to diagnose prostate cancer. Even the results of a biopsy may not tell you if your cancer needs to  be treated. Slow-growing prostate cancer may not need any treatment other than monitoring, so diagnosing and treating it may cause unnecessary stress or other side effects. ?Questions to ask your health care provider ?When should I start prostate cancer screening? ?What is my risk for prostate cancer? ?How often do I need screening? ?What type of screening tests do I need? ?How do I get my test results? ?What do my results mean? ?Do I need treatment? ?Where to find more information ?The American Cancer Society: www.cancer.org ?American Urological Association: www.auanet.org ?Contact a health care provider if: ?You have difficulty urinating. ?You have pain when you urinate or ejaculate. ?You have blood in your urine or semen. ?You have pain in your back or in the area of your prostate. ?Summary ?Prostate cancer is a common type of cancer in men. The prostate gland is located below the bladder and in front of the rectum. This gland adds fluid to semen during ejaculation. ?Prostate cancer screening may identify cancer at an early stage, when the cancer can be treated more easily and is less likely to have spread to other areas of the body. ?The prostate-specific antigen (PSA) test is the recommended screening test for prostate cancer, but it has associated risks. ?Discuss the risks and benefits of prostate cancer screening with your health care provider. If you are age 17 or older, the risks that screening can cause are greater than the benefits that it may provide. ?This information is not intended to replace advice given to you by your health care provider. Make sure you discuss any questions you have with your health care provider. ?Document Revised: 04/14/2021 Document Reviewed: 04/14/2021 ?Elsevier Patient Education ? [place]. ? ?Benign Prostatic Hyperplasia ? ?Benign prostatic hyperplasia (BPH) is an enlarged prostate gland that is caused by the normal aging process. The prostate may get bigger as a man  gets older. The condition is not caused by cancer. The prostate is a walnut-sized gland that is involved in the production of semen. It is located in front of the rectum and below the bladder. The bladder stores urine. The urethra carries stored urine out of the body. ?An enlarged prostate can press on the urethra. This can make it harder to pass urine. The buildup of urine in the bladder can cause infection. Back pressure and infection may progress to bladder damage and kidney (renal) failure. ?What are the causes? ?This condition is part of the normal aging process. However, not all men develop problems from this condition. If the prostate enlarges away from the urethra, urine flow will not be blocked. If it enlarges toward the urethra and compresses it, there will be problems passing urine. ?What increases the risk? ?This condition is more likely to develop in men older than 50 years. ?What are the signs or symptoms? ?Symptoms of this condition include: ?Getting up often during the night to urinate. ?Needing to urinate frequently during the day. ?Difficulty starting urine flow. ?Decrease in size and strength of your urine stream. ?Leaking (dribbling) after urinating. ?Inability to pass urine. This needs immediate treatment. ?Inability to completely  empty your bladder. ?Pain when you pass urine. This is more common if there is also an infection. ?Urinary tract infection (UTI). ?How is this diagnosed? ?This condition is diagnosed based on your medical history, a physical exam, and your symptoms. Tests will also be done, such as: ?A post-void bladder scan. This measures any amount of urine that may remain in your bladder after you finish urinating. ?A digital rectal exam. In a rectal exam, your health care provider checks your prostate by putting a lubricated, gloved finger into your rectum to feel the back of your prostate gland. This exam detects the size of your gland and any abnormal lumps or growths. ?An exam  of your urine (urinalysis). ?A prostate specific antigen (PSA) screening. This is a blood test used to screen for prostate cancer. ?An ultrasound. This test uses sound waves to electronically produce a pi

## 2022-03-12 NOTE — Progress Notes (Signed)
? ?  03/12/2022 ?6:49 PM  ? ?Donald Kennedy ?[birth year] ?[id number] ? ?Reason for visit: Follow up recurrent UTIs, BPH, ED, PSA screening ? ?HPI: ?66 year old male who had recurrent E. coli prostatitis in 2021, and most recently right-sided epididymitis.  He underwent further evaluation with a CT urogram that was benign and showed a 47 g prostate, and cystoscopy showed a moderate size prostate with some obstructing lateral lobes of the bladder was grossly normal.  He opted to continue Flomax.  PVRs have been normal.  PVR is normal again today at 0 mL.  We have previously discussed UroLift, but he wanted to hold off at this time.  He has been doing very well on the Flomax and really denies significant urinary symptoms today aside from some urgency.  He denies any UTIs over the last year. ? ?He has a history of a slightly abnormal scrotal ultrasound with some heterogeneity of the epididymis.  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  He denies any scrotal pain. ? ?In terms of his ED, he really has not had to use the Cialis, and is doing well. ? ?PSA is normal at 1.19 from April 2023. ? ?Flomax refilled ?RTC 1 year for PVR, can likely follow-up with PCP in the future if doing well at that time ? ?Sondra Come, MD ? ?[facility] ?[address] ?[place], [place] [address] ?[phone] ? ? ?

## 2022-03-26 IMAGING — CT CT ABD-PEL WO/W CM
3 of 12 series · 12 of 46 positions shown, 18 images · IV contrast (omnipaque)
Comparison: None.

CLINICAL DATA: Recurrent urinary tract infections over the last 6
months. History of benign prostatic hypertrophy.

EXAM:
CT ABDOMEN AND PELVIS WITHOUT AND WITH CONTRAST
TECHNIQUE: Multidetector CT imaging of the abdomen and pelvis was performed
following the standard protocol before and following the bolus
administration of intravenous contrast.
CONTRAST:  125mL OMNIPAQUE IOHEXOL 300 MG/ML  SOLN

[Series 2: abd without pre 5.00 · axial · non-contrast · 0.78mm/px · z∈[-1528,-1153]mm · 6 of 105 slices shown, 11 images]
[im 15/105  soft-tissue]
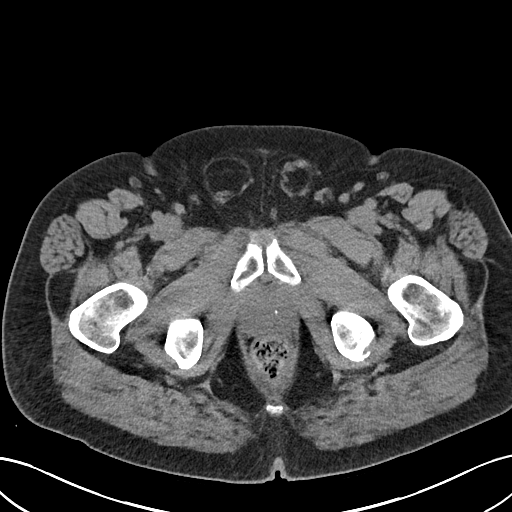
[im 15/105  bone]
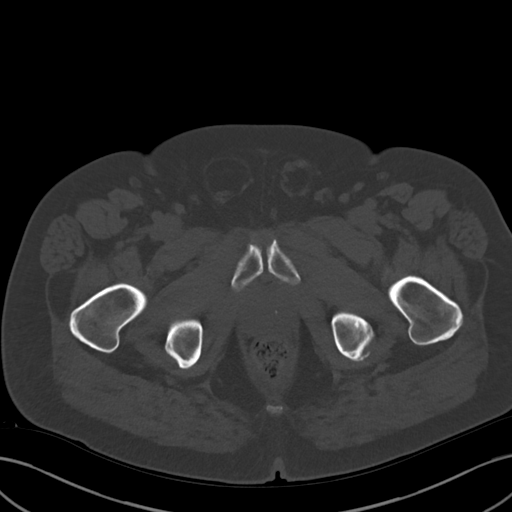
[im 30/105  soft-tissue]
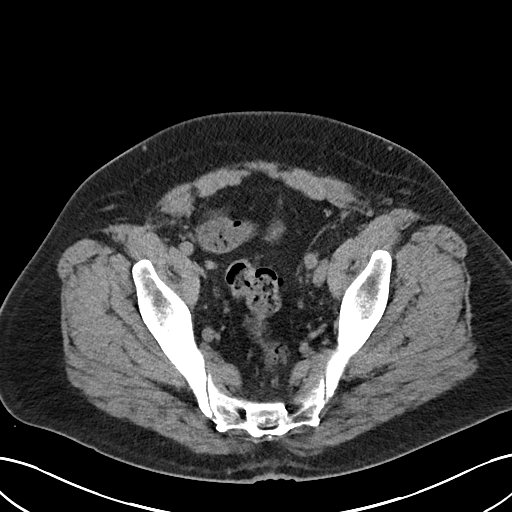
[im 45/105  soft-tissue]
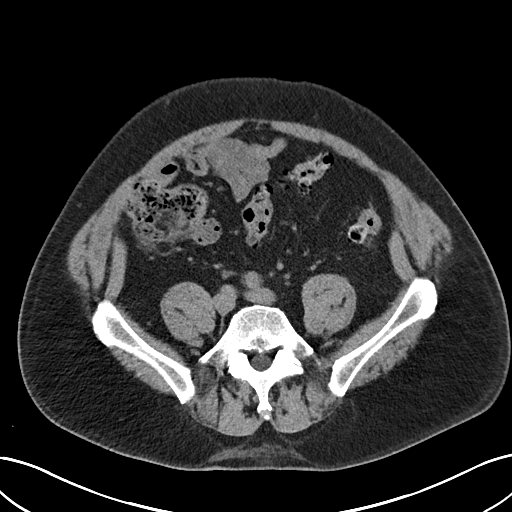
[im 45/105  lung]
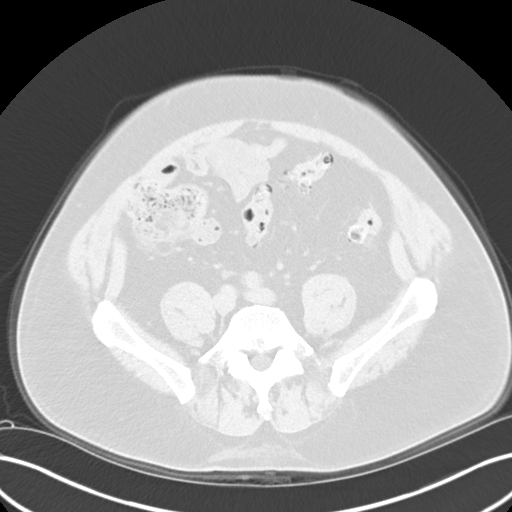
[im 60/105  soft-tissue]
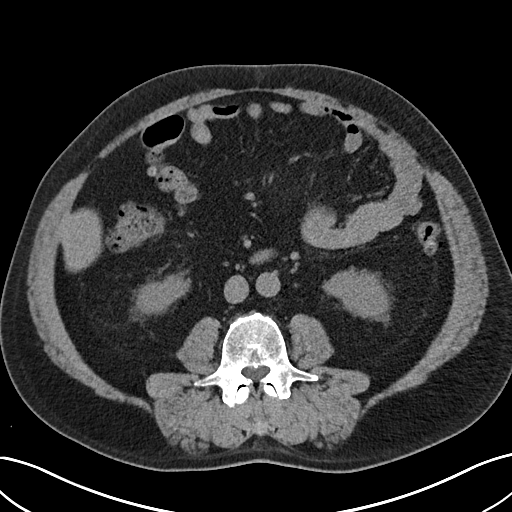
[im 60/105  lung]
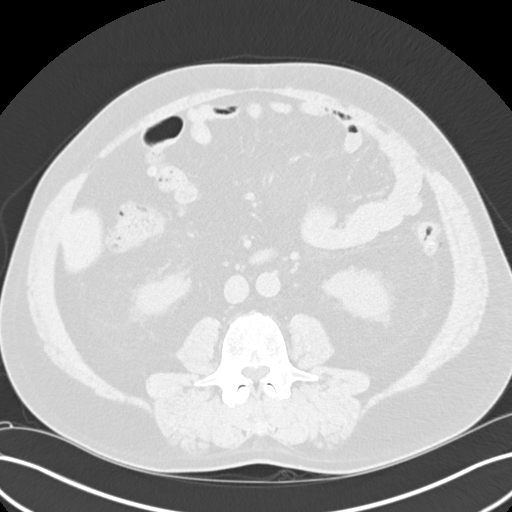
[im 75/105  soft-tissue]
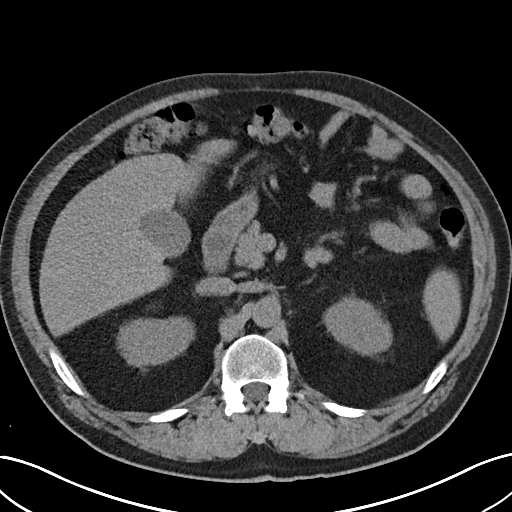
[im 75/105  lung]
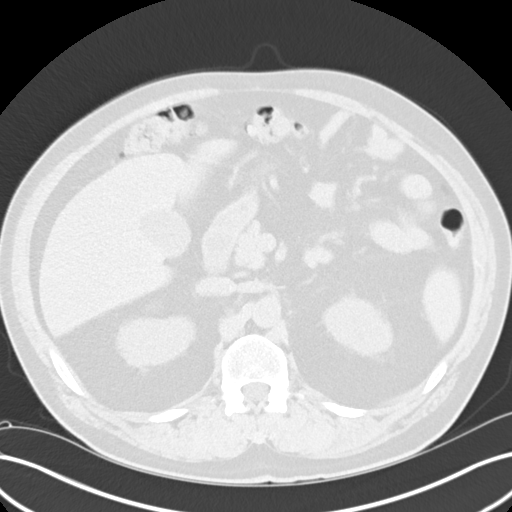
[im 90/105  soft-tissue]
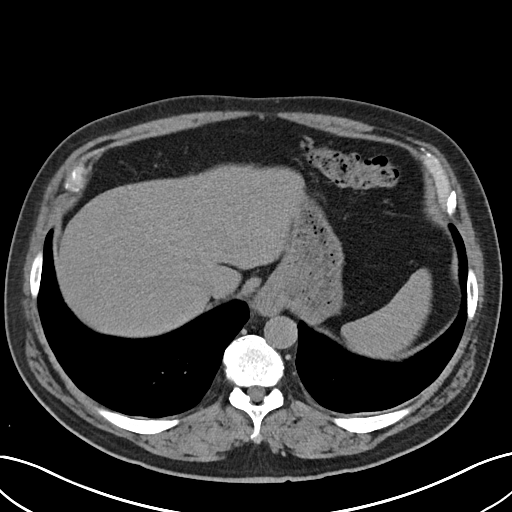
[im 90/105  lung]
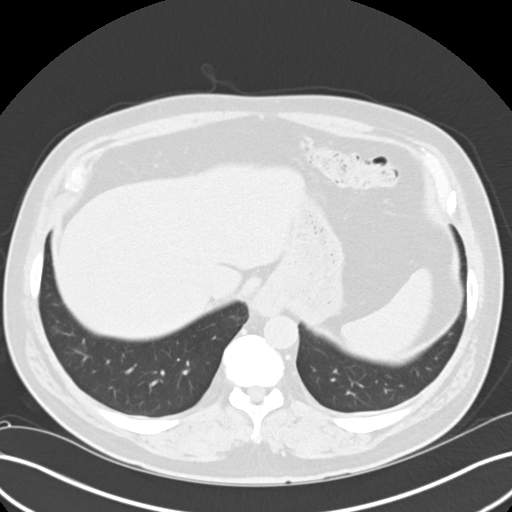

[Series 5: cor without without pre 2.00 cor · coronal · non-contrast · 0.78mm/px · 2 of 165 slices shown, 3 images]
[im 55/165  soft-tissue]
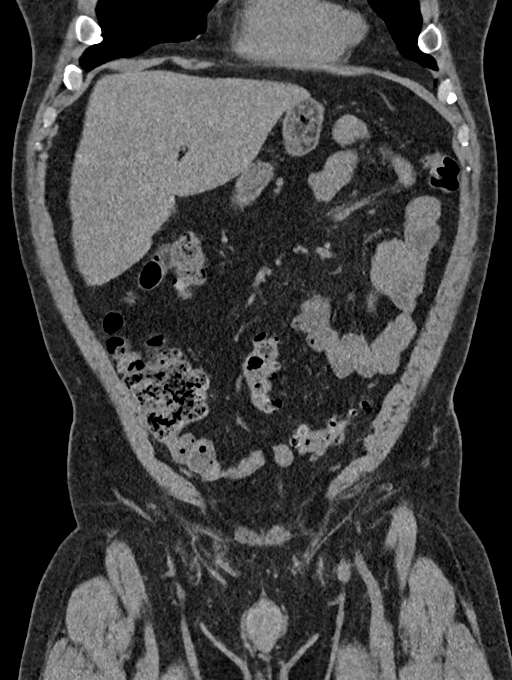
[im 55/165  bone]
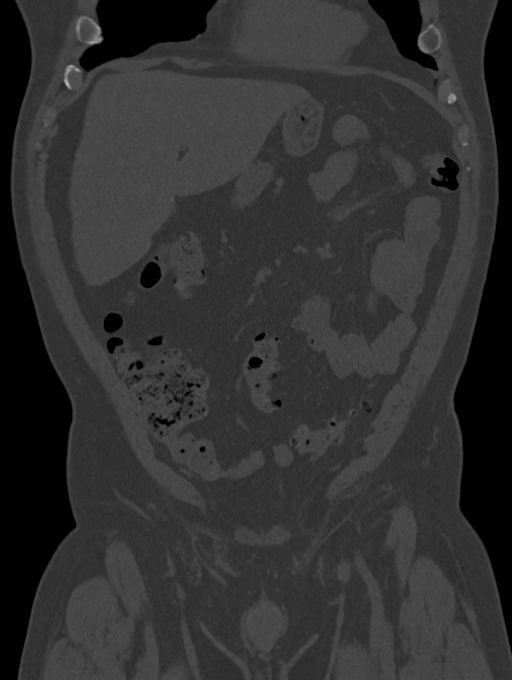
[im 110/165  soft-tissue]
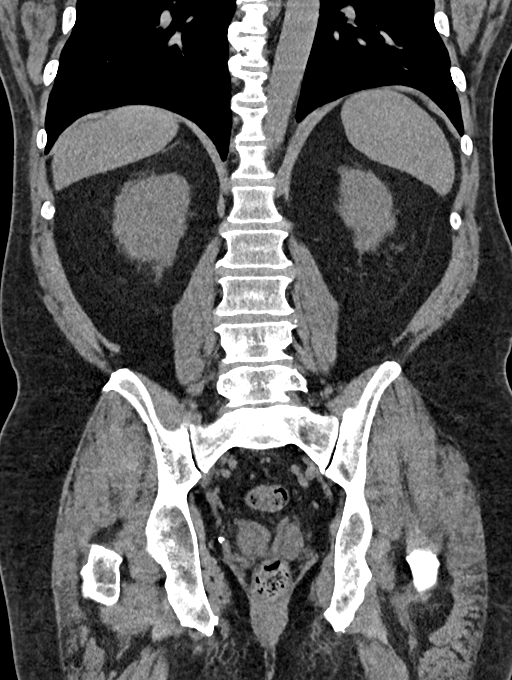

[Series 9: axial with hematuria with 5.00 · axial · 0.78mm/px · z∈[-1528,-1303]mm · 4 of 105 slices shown]
[im 15/105  soft-tissue]
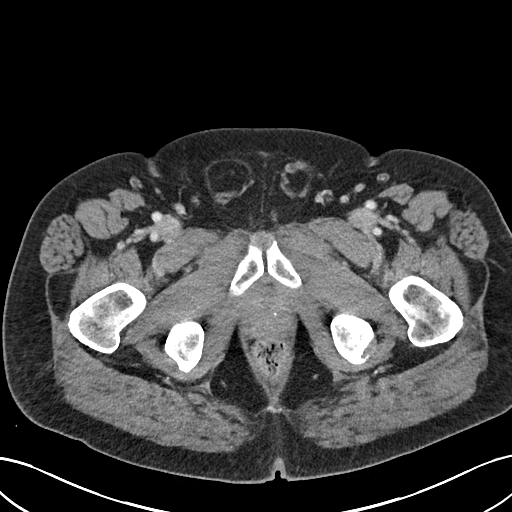
[im 30/105  soft-tissue]
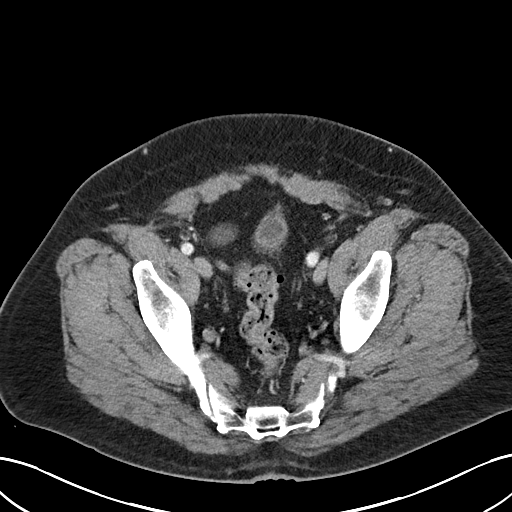
[im 45/105  soft-tissue]
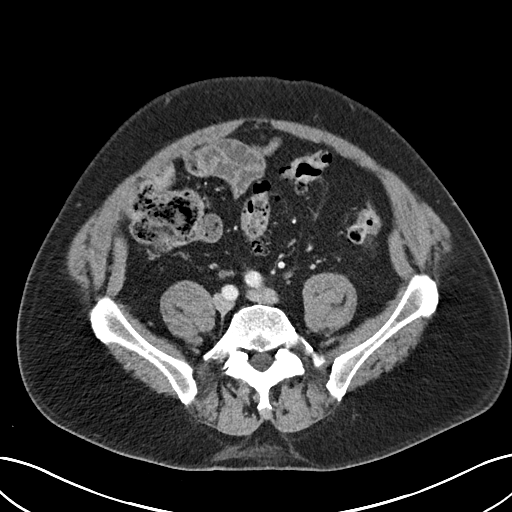
[im 60/105  soft-tissue]
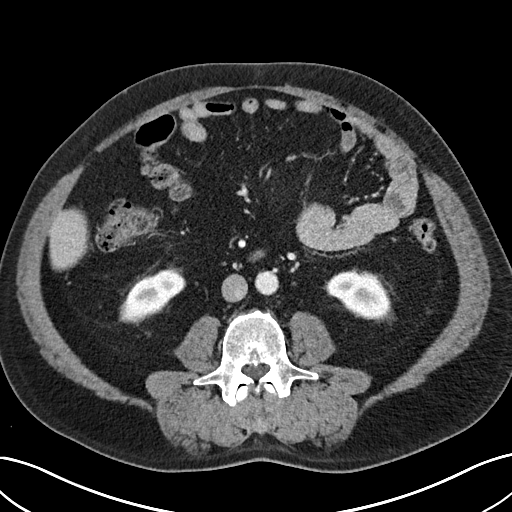

[12 of 46 positions shown; findings below may reference images not displayed]

FINDINGS: Lower chest: Small type 1 hiatal hernia.

Hepatobiliary: Unremarkable

Pancreas: Unremarkable

Spleen: Unremarkable

Adrenals/Urinary Tract: The adrenal glands appear normal.

Focal scarring in the left mid to lower kidney posteriorly with some
associated calcification.

0.9 by 1.1 cm exophytic hypodense lesion from the left mid kidney is
technically too small to characterize but highly likely to be a
benign cyst. Other smaller hypodense lesions in both kidneys are
likely cysts but too small to characterize.

No significant abnormal filling defect or abnormal enhancement along
the urothelium. The urinary bladder appears normal.

Stomach/Bowel: Small type 1 hiatal hernia. Descending and sigmoid
colon diverticulosis. Normal appendix.

Vascular/Lymphatic: Unremarkable

Reproductive: Mild prostatomegaly.

Other: No supplemental non-categorized findings.

Musculoskeletal: Lumbar spondylosis and degenerative disc disease.
Fatty spermatic cords, right greater than left.
IMPRESSION: 1. A cause for the patient's recurrent urinary tract infections is
not identified.
2. Focal scarring in the left mid to lower kidney posteriorly with
some associated calcification.
3. Mild prostatomegaly.
4. Small type 1 hiatal hernia.
5. Descending and sigmoid colon diverticulosis.
6. Lumbar spondylosis and degenerative disc disease.
7. Fatty spermatic cords, right greater than left.
8. Other smaller hypodense lesions in both kidneys are likely cysts
but too small to characterize.

## 2022-05-19 NOTE — Discharge Instructions (Signed)
Instructions after Total Knee Replacement   Donald Kennedy P. Donald Kennedy, Jr., M.D.     Dept. of Orthopaedics & Sports Medicine  [facility]  [address]  [place], [place]  [address]  Phone: [phone]   Fax: [phone]    DIET: Drink plenty of non-alcoholic fluids. Resume your normal diet. Include foods high in fiber.  ACTIVITY:  You may use crutches or a walker with weight-bearing as tolerated, unless instructed otherwise. You may be weaned off of the walker or crutches by your Physical Therapist.  Do NOT place pillows under the knee. Anything placed under the knee could limit your ability to straighten the knee.   Continue doing gentle exercises. Exercising will reduce the pain and swelling, increase motion, and prevent muscle weakness.   Please continue to use the TED compression stockings for 6 weeks. You may remove the stockings at night, but should reapply them in the morning. Do not drive or operate any equipment until instructed.  WOUND CARE:  Continue to use the PolarCare or ice packs periodically to reduce pain and swelling. You may bathe or shower after the staples are removed at the first office visit following surgery.  MEDICATIONS: You may resume your regular medications. Please take the pain medication as prescribed on the medication. Do not take pain medication on an empty stomach. You have been given a prescription for a blood thinner (Lovenox or Coumadin). Please take the medication as instructed. (NOTE: After completing a 2 week course of Lovenox, take one Enteric-coated aspirin once a day. This along with elevation will help reduce the possibility of phlebitis in your operated leg.) Do not drive or drink alcoholic beverages when taking pain medications.  CALL THE OFFICE FOR: Temperature above 101 degrees Excessive bleeding or drainage on the dressing. Excessive swelling, coldness, or paleness of the toes. Persistent nausea and vomiting.  FOLLOW-UP:  You  should have an appointment to return to the office in 10-14 days after surgery. Arrangements have been made for continuation of Physical Therapy (either home therapy or outpatient therapy).   [facility] Department Directory         [email or web address]       [email or web address]          Cardiology  Appointments: [place] - [phone] [place] - [phone]  Endocrinology  Appointments: [place] - [phone] [place] - [phone]  Gastroenterology  Appointments: [place] - [phone] [place] - [phone]        General Surgery   Appointments: [place] - [phone]  Internal Medicine/Family Medicine  Appointments: [place] - [phone] [place] - [phone] [place] - [phone]  Metabolic and Weigh Loss Surgery  Appointments: [place] - [phone]        Neurology  Appointments: [place] - [phone] [place] - [phone]  Neurosurgery  Appointments: [place] - [phone]  Obstetrics & Gynecology  Appointments: [place] - [phone] [place] - [phone]        Pediatrics  Appointments: [place] - [phone] [place] - [phone]  Physiatry  Appointments: [place] -[phone]  Physical Therapy  Appointments: [place] - [phone] [place] - [phone]        Podiatry  Appointments: [place] - [phone] [place] - [phone]  Pulmonology  Appointments: [place] - [phone]  Rheumatology  Appointments: [place] - [phone]        [place] Location: [facility]  [address] , [place]  [address]  [place] Location: [facility] [address] [place], [place]  [address]  [place] Location: [facility] [address] [place], [place]  [address]    

## 2022-05-22 ENCOUNTER — Encounter
Admission: RE | Admit: 2022-05-22 | Discharge: 2022-05-22 | Disposition: A | Discharge status: Home / Self Care | Payer: Medicare PPO | Source: Ambulatory Visit | Attending: Orthopedic Surgery | Admitting: Orthopedic Surgery

## 2022-05-22 ENCOUNTER — Other Ambulatory Visit: Payer: Self-pay

## 2022-05-22 VITALS — BP 138/77 | HR 63 | Resp 16 | Ht 67.0 in | Wt 237.4 lb

## 2022-05-22 DIAGNOSIS — Z01812 Encounter for preprocedural laboratory examination: Secondary | ICD-10-CM

## 2022-05-22 DIAGNOSIS — R351 Nocturia: Secondary | ICD-10-CM | POA: Insufficient documentation

## 2022-05-22 DIAGNOSIS — M1712 Unilateral primary osteoarthritis, left knee: Secondary | ICD-10-CM | POA: Diagnosis not present

## 2022-05-22 DIAGNOSIS — E785 Hyperlipidemia, unspecified: Secondary | ICD-10-CM

## 2022-05-22 DIAGNOSIS — Z01818 Encounter for other preprocedural examination: Secondary | ICD-10-CM | POA: Insufficient documentation

## 2022-05-22 DIAGNOSIS — Z96651 Presence of right artificial knee joint: Secondary | ICD-10-CM

## 2022-05-22 DIAGNOSIS — N401 Enlarged prostate with lower urinary tract symptoms: Secondary | ICD-10-CM | POA: Diagnosis not present

## 2022-05-22 LAB — COMPREHENSIVE METABOLIC PANEL
ALT: 18 U/L (ref 0–44)
AST: 17 U/L (ref 15–41)
Albumin: 4.1 g/dL (ref 3.5–5.0)
Alkaline Phosphatase: 84 U/L (ref 38–126)
Anion gap: 5 (ref 5–15)
BUN: 16 mg/dL (ref 8–23)
CO2: 27 mmol/L (ref 22–32)
Calcium: 8.9 mg/dL (ref 8.9–10.3)
Chloride: 105 mmol/L (ref 98–111)
Creatinine, Ser: 1 mg/dL (ref 0.61–1.24)
GFR, Estimated: >60 mL/min (ref >60)
Glucose, Bld: 86 mg/dL (ref 70–99)
Potassium: 3.9 mmol/L (ref 3.5–5.1)
Sodium: 137 mmol/L (ref 135–145)
Total Bilirubin: 0.8 mg/dL (ref 0.3–1.2)
Total Protein: 6.6 g/dL (ref 6.5–8.1)

## 2022-05-22 LAB — SEDIMENTATION RATE: Sed Rate: 4 mm/hr (ref 0–20)

## 2022-05-22 LAB — CBC
HCT: 42.8 % (ref 39.0–52.0)
Hemoglobin: 14.2 g/dL (ref 13.0–17.0)
MCH: 29.2 pg (ref 26.0–34.0)
MCHC: 33.2 g/dL (ref 30.0–36.0)
MCV: 87.9 fL (ref 80.0–100.0)
Platelets: 274 10*3/uL (ref 150–400)
RBC: 4.87 MIL/uL (ref 4.22–5.81)
RDW: 12.4 % (ref 11.5–15.5)
WBC: 4.9 10*3/uL (ref 4.0–10.5)
nRBC: 0 % (ref 0.0–0.2)

## 2022-05-22 LAB — URINALYSIS, ROUTINE W REFLEX MICROSCOPIC
Bilirubin Urine: NEGATIVE
Glucose, UA: NEGATIVE mg/dL
Hgb urine dipstick: NEGATIVE
Ketones, ur: NEGATIVE mg/dL
Leukocytes,Ua: NEGATIVE
Nitrite: NEGATIVE
Protein, ur: NEGATIVE mg/dL
Specific Gravity, Urine: 1.009 (ref 1.005–1.030)
pH: 5 (ref 5.0–8.0)

## 2022-05-22 LAB — TYPE AND SCREEN
ABO/RH(D): A POS
Antibody Screen: NEGATIVE

## 2022-05-22 LAB — SURGICAL PCR SCREEN
MRSA, PCR: NEGATIVE
Staphylococcus aureus: NEGATIVE

## 2022-05-22 LAB — C-REACTIVE PROTEIN: CRP: <0.5 mg/dL (ref <1.0)

## 2022-05-22 NOTE — Patient Instructions (Signed)
Your procedure is scheduled on: 06/03/22 - Wednesday Report to the Registration Desk on the 1st floor of the [facility]. To find out your arrival time, please call [phone] between 1PM - 3PM on: 06/02/22 - Tuesday If your arrival time is 6:00 am, do not arrive prior to that time as the [facility] entrance doors do not open until 6:00 am.  REMEMBER: Instructions that are not followed completely may result in serious medical risk, up to and including death; or upon the discretion of your surgeon and anesthesiologist your surgery may need to be rescheduled.  Do not eat food after midnight the night before surgery.  No gum chewing, lozengers or hard candies.  You may however, drink CLEAR liquids up to 2 hours before you are scheduled to arrive for your surgery. Do not drink anything within 2 hours of your scheduled arrival time.  Clear liquids include: - water  - apple juice without pulp - gatorade (not RED colors) - black coffee or tea (Do NOT add milk or creamers to the coffee or tea) Do NOT drink anything that is not on this list.  In addition, your doctor has ordered for you to drink the provided  Ensure Pre-Surgery Clear Carbohydrate Drink  Drinking this carbohydrate drink up to two hours before surgery helps to reduce insulin resistance and improve patient outcomes. Please complete drinking 2 hours prior to scheduled arrival time.  TAKE THESE MEDICATIONS THE MORNING OF SURGERY WITH A SIP OF WATER: NONE  One week prior to surgery: Stop Anti-inflammatories (NSAIDS) such as Advil, Aleve, Ibuprofen, Motrin, Naproxen, Naprosyn and Aspirin based products such as Excedrin, Goodys Powder, BC Powder.  Stop ANY OVER THE COUNTER supplements until after surgery.  You may take Tylenol if needed for pain up until the day of surgery.  No Alcohol for 24 hours before or after surgery.  No Smoking including e-cigarettes for 24 hours prior to surgery.  No chewable tobacco products for at  least 6 hours prior to surgery.  No nicotine patches on the day of surgery.  Do not use any "recreational" drugs for at least a week prior to your surgery.  Please be advised that the combination of cocaine and anesthesia may have negative outcomes, up to and including death. If you test positive for cocaine, your surgery will be cancelled.  On the morning of surgery brush your teeth with toothpaste and water, you may rinse your mouth with mouthwash if you wish. Do not swallow any toothpaste or mouthwash.  Use CHG Soap or wipes as directed on instruction sheet.  Do not wear jewelry, make-up, hairpins, clips or nail polish.  Do not wear lotions, powders, or perfumes.   Do not shave body from the neck down 48 hours prior to surgery just in case you cut yourself which could leave a site for infection.  Also, freshly shaved skin may become irritated if using the CHG soap.  Contact lenses, hearing aids and dentures may not be worn into surgery.  Do not bring valuables to the hospital. [facility] is not responsible for any missing/lost belongings or valuables.   Notify your doctor if there is any change in your medical condition (cold, fever, infection).  Wear comfortable clothing (specific to your surgery type) to the hospital.  After surgery, you can help prevent lung complications by doing breathing exercises.  Take deep breaths and cough every 1-2 hours. Your doctor may order a device called an Incentive Spirometer to help you take deep  breaths. When coughing or sneezing, hold a pillow firmly against your incision with both hands. This is called "splinting." Doing this helps protect your incision. It also decreases belly discomfort.  If you are being admitted to the hospital overnight, leave your suitcase in the car. After surgery it may be brought to your room.  If you are being discharged the day of surgery, you will not be allowed to drive home. You will need a responsible adult  (18 years or older) to drive you home and stay with you that night.   If you are taking public transportation, you will need to have a responsible adult (18 years or older) with you. Please confirm with your physician that it is acceptable to use public transportation.   Please call the [facility] at [phone] if you have any questions about these instructions.  Surgery Visitation Policy:  Patients undergoing a surgery or procedure may have two family members or support persons with them as long as the person is not COVID-19 positive or experiencing its symptoms.   Inpatient Visitation:    Visiting hours are 7 a.m. to 8 p.m. Up to four visitors are allowed at one time in a patient room, including children. The visitors may rotate out with other people during the day. One designated support person (adult) may remain overnight.

## 2022-05-31 NOTE — H&P (Signed)
ORTHOPAEDIC HISTORY & PHYSICAL Donald Kennedy, [name] - 05/26/2022 3:45 PM EDT Formatting of this note is different from the original. [facility] Chief Complaint:   Chief Complaint  Patient presents with  Knee Pain  H & P LEFT KNEE   History of Present Illness:   Donald Kennedy is a 66 y.o. male that presents to clinic today for his preoperative history and evaluation. Patient presents unaccompanied. The patient is scheduled to undergo a left total knee arthroplasty on 06/03/22 by Dr. Ernest Pine. His pain began several years ago. The pain is located primarily along the medial aspect of the knee. He describes his pain as worse with weightbearing. He reports associated swelling with some giving way of the knee. He denies associated numbness or tingling, denies locking.   The patient's symptoms have progressed to the point that they decrease his quality of life. The patient has previously undergone conservative treatment including NSAIDS and injections to the knee without adequate control of his symptoms.  Denies history of lumbar surgery, history of DVT, or significant cardiac history.   Past Medical, Surgical, Family, Social History, Allergies, Medications:   Past Medical History:  Past Medical History:  Diagnosis Date  Allergy 1960  Asthma, unspecified asthma severity, unspecified whether complicated, unspecified whether persistent When younger, just some whizzing now...  Carpal tunnel syndrome  Cervicalgia  Depression But much better last few years  Hyperlipidemia  Hypertension  OA (osteoarthritis)   Past Surgical History:  Past Surgical History:  Procedure Laterality Date  COLONOSCOPY 02/01/2012  Adenomatous Polyps: CBF 02/2015; recall ltr mailed 12/06/2014 (dw)  COLONOSCOPY 06/27/2015  Adenomatous Polyp: CBF 06/2020  COLONOSCOPY 08/04/2021  INTERNAL HEMORRHOIDS, DIVERTICULOSIS, CBC 2032/TKT  Right knee arthroscopy, partial medial  meniscectomy, and chondroplasty 11/26/2021  Dr Ernest Pine  Right total knee arthroplasty using computer-assisted navigation 01/19/2022  Dr Ernest Pine  ENDOSCOPIC CARPAL TUNNEL RELEASE Right  TONSILLECTOMY   Current Medications:  Current Outpatient Medications  Medication Sig Dispense Refill  acetaminophen (TYLENOL) 325 MG tablet Take 650 mg by mouth as needed for Pain  carboxymethylcellulose-glycerin (REFRESH OPTIVE) 0.5-0.9 % ophthalmic solution Place 1-2 drops into both eyes as needed for Dry Eyes  celecoxib (CELEBREX) 100 MG capsule TAKE 1 CAPSULE(100 MG) BY MOUTH TWICE DAILY 90 capsule 0  cetirizine (ZYRTEC) 10 MG tablet Take 10 mg by mouth once daily.  clobetasoL (TEMOVATE) 0.05 % cream Apply topically 2 (two) times daily as needed  cyanocobalamin (VITAMIN B12) 1000 MCG tablet Take 1 tablet (1,000 mcg total) by mouth once daily 30 tablet 5  dextran 70-hypromellose (GENTEAL TEARS MILD) 0.1-0.3 % Drop Apply to eye once daily  neomycin-bacitracnZn-polymyxnB 3.5-500-10,000 mg-unit-unit Oint Apply topically as needed  tamsulosin (FLOMAX) 0.4 mg capsule Take 0.4 mg by mouth once daily Take 30 minutes after same meal each day.  trolamine salicylate (ASPERCREME) 10 % cream Apply 1 Application topically 2 (two) times daily as needed  simvastatin (ZOCOR) 40 MG tablet Take 1 tablet (40 mg total) by mouth at bedtime for 180 days 90 tablet 1  tamsulosin (FLOMAX) 0.4 mg capsule Take 1 capsule (0.4 mg total) by mouth once daily Take 30 minutes after same meal each day. 90 capsule 3   No current facility-administered medications for this visit.   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, Optician, dispensing   Comment: Farming now  Tobacco Use  Smoking status: Never  Smokeless tobacco: Never  Vaping Use  Vaping Use: Never used  Substance and Sexual Activity  Alcohol use: No  Alcohol/week: 0.0 standard drinks  Drug use: Never  Sexual activity: Defer  Partners: Female   Family History:  Family History  Problem Relation Age of Onset  Stroke Mother  High blood pressure (Hypertension) Mother  Throat cancer Father  Coronary Artery Disease (Blocked arteries around heart) Father  Allergies Other  Sibling   Review of Systems:   A 10+ ROS was performed, reviewed, and the pertinent orthopaedic findings are documented in the HPI.   Physical Examination:   BP (!) 140/82 (BP Location: Left upper arm, Patient Position: Sitting, BP Cuff Size: Large Adult)  Ht 170.2 cm (5\' 7" )  Wt (!) 106.1 kg (234 lb)  BMI 36.65 kg/m   Patient is a well-developed, well-nourished male in no acute distress. Patient has normal mood and affect. Patient is alert and oriented to person, place, and time.   HEENT: Atraumatic, normocephalic. Pupils equal and reactive to light. Extraocular motion intact. Noninjected sclera.  Cardiovascular: Regular rate and rhythm, with no murmurs, rubs, or gallops. Distal pulses palpable.  Respiratory: Lungs clear to auscultation bilaterally.   Left Knee: Soft tissue swelling: minimal Effusion: none Erythema: none Crepitance: mild Tenderness: medial Alignment: relative varus Mediolateral laxity: medial pseudolaxity Posterior sag: negative Patellar tracking: Good tracking without evidence of subluxation or tilt Atrophy: No significant atrophy.  Quadriceps tone was fair to good. Range of motion: 0/2/135 degrees  Able to dorsiflex and plantarflex the ankle. Able to flex and extend the toes.   Sensation intact over the saphenous, lateral sural cutaneous, superficial fibular, and deep fibular nerve distributions.  Tests Performed/Reviewed:  X-rays  X-ray knee left 3  views  Result Date: 05/07/2022 3 views of the left knee were reviewed. Images reveal severe loss of medial compartment joint space with osteophyte formation. No fractures or dislocations. No other osseous abnormality noted.  Impression:   ICD-10-CM  1. Primary osteoarthritis of left knee M17.12   Plan:   The patient has end-stage degenerative changes of the left knee. It was explained to the patient that the condition is progressive in nature. Having failed conservative treatment, the patient has elected to proceed with a total joint arthroplasty. The patient will undergo a total joint arthroplasty with Dr. 07/08/2022. The risks of surgery, including blood clot and infection, were discussed with the patient. Measures to reduce these risks, including the use of anticoagulation, perioperative antibiotics, and early ambulation were discussed. The importance of postoperative physical therapy was discussed with the patient. The patient elects to proceed with surgery. The patient is instructed to stop all blood thinners prior to surgery. The patient is instructed to call the hospital the day before surgery to learn of the proper arrival time.   Contact our office with any questions or concerns. Follow up as indicated, or sooner should any new problems arise, if conditions worsen, or if they are otherwise concerned.   Ernest Pine, PA -C [facility] [address] [place], [place] Phone: [phone]  This note was generated in part with voice recognition software and I apologize for any typographical errors that were not detected and corrected.  Electronically signed by [phone], PA at 05/28/2022 5:32 PM EDT

## 2022-06-02 MED ORDER — GABAPENTIN 300 MG PO CAPS: 300.0000 mg | ORAL_CAPSULE | Freq: Once | ORAL | Status: AC

## 2022-06-02 MED ORDER — CHLORHEXIDINE GLUCONATE 0.12 % MT SOLN: 15.0000 mL | Freq: Once | OROMUCOSAL | Status: AC

## 2022-06-02 MED ORDER — DEXAMETHASONE SODIUM PHOSPHATE 10 MG/ML IJ SOLN: 8.0000 mg | Freq: Once | INTRAMUSCULAR | Status: AC

## 2022-06-02 MED ORDER — FAMOTIDINE 20 MG PO TABS: 20.0000 mg | ORAL_TABLET | Freq: Once | ORAL | Status: AC

## 2022-06-02 MED ORDER — ORAL CARE MOUTH RINSE: 15.0000 mL | Freq: Once | OROMUCOSAL | Status: AC

## 2022-06-02 MED ORDER — CHLORHEXIDINE GLUCONATE 4 % EX LIQD
60.0000 mL | Freq: Once | CUTANEOUS | Status: AC
  Administered 2022-06-03: 4 via TOPICAL

## 2022-06-02 MED ORDER — LACTATED RINGERS IV SOLN: INTRAVENOUS | Status: DC

## 2022-06-02 MED ORDER — CEFAZOLIN SODIUM-DEXTROSE 2-4 GM/100ML-% IV SOLN
2.0000 g | INTRAVENOUS | Status: AC
  Administered 2022-06-03: 2 g via INTRAVENOUS

## 2022-06-02 MED ORDER — CELECOXIB 200 MG PO CAPS: 400.0000 mg | ORAL_CAPSULE | Freq: Once | ORAL | Status: AC

## 2022-06-02 MED ORDER — TRANEXAMIC ACID-NACL 1000-0.7 MG/100ML-% IV SOLN
1000.0000 mg | INTRAVENOUS | Status: AC
  Administered 2022-06-03: 1000 mg via INTRAVENOUS

## 2022-06-03 ENCOUNTER — Observation Stay
Admission: RE | Admit: 2022-06-03 | Discharge: 2022-06-04 | Disposition: A | Discharge status: Home Health | Payer: Medicare PPO | Attending: Orthopedic Surgery | Admitting: Orthopedic Surgery

## 2022-06-03 ENCOUNTER — Other Ambulatory Visit: Payer: Self-pay

## 2022-06-03 ENCOUNTER — Ambulatory Visit: Payer: Medicare PPO | Admitting: Urgent Care

## 2022-06-03 ENCOUNTER — Encounter: Payer: Self-pay | Admitting: Orthopedic Surgery

## 2022-06-03 ENCOUNTER — Ambulatory Visit
Admission: RE | Admit: 2022-06-03 | Discharge: 2022-06-03 | Disposition: A | Discharge status: Home / Self Care | Payer: Self-pay | Source: Ambulatory Visit | Attending: Orthopedic Surgery | Admitting: Orthopedic Surgery

## 2022-06-03 ENCOUNTER — Ambulatory Visit: Payer: Medicare PPO | Admitting: Certified Registered Nurse Anesthetist

## 2022-06-03 ENCOUNTER — Encounter
Admission: RE | Disposition: A | Discharge status: Home Health | Payer: Self-pay | Source: Home / Self Care | Attending: Orthopedic Surgery

## 2022-06-03 ENCOUNTER — Other Ambulatory Visit: Payer: Self-pay | Admitting: Orthopedic Surgery

## 2022-06-03 ENCOUNTER — Observation Stay: Payer: Medicare PPO

## 2022-06-03 DIAGNOSIS — Z79899 Other long term (current) drug therapy: Secondary | ICD-10-CM | POA: Diagnosis not present

## 2022-06-03 DIAGNOSIS — Z96651 Presence of right artificial knee joint: Secondary | ICD-10-CM

## 2022-06-03 DIAGNOSIS — N401 Enlarged prostate with lower urinary tract symptoms: Secondary | ICD-10-CM

## 2022-06-03 DIAGNOSIS — M25562 Pain in left knee: Secondary | ICD-10-CM

## 2022-06-03 DIAGNOSIS — J45909 Unspecified asthma, uncomplicated: Secondary | ICD-10-CM | POA: Insufficient documentation

## 2022-06-03 DIAGNOSIS — E785 Hyperlipidemia, unspecified: Secondary | ICD-10-CM

## 2022-06-03 DIAGNOSIS — I10 Essential (primary) hypertension: Secondary | ICD-10-CM | POA: Diagnosis not present

## 2022-06-03 DIAGNOSIS — M1712 Unilateral primary osteoarthritis, left knee: Principal | ICD-10-CM

## 2022-06-03 DIAGNOSIS — Z96659 Presence of unspecified artificial knee joint: Secondary | ICD-10-CM

## 2022-06-03 HISTORY — PX: KNEE ARTHROPLASTY: SHX992

## 2022-06-03 SURGERY — ARTHROPLASTY, KNEE, TOTAL, USING IMAGELESS COMPUTER-ASSISTED NAVIGATION
Anesthesia: Spinal | Site: Knee | Laterality: Left

## 2022-06-03 MED ORDER — PANTOPRAZOLE SODIUM 40 MG PO TBEC
40.0000 mg | DELAYED_RELEASE_TABLET | Freq: Two times a day (BID) | ORAL | Status: DC
  Administered 2022-06-03 – 2022-06-04 (×2): 40 mg via ORAL
  Filled 2022-06-03 (×2): qty 1

## 2022-06-03 MED ORDER — ONDANSETRON HCL 4 MG/2ML IJ SOLN: 4.0000 mg | Freq: Once | INTRAMUSCULAR | Status: DC | PRN

## 2022-06-03 MED ORDER — DIPHENHYDRAMINE HCL 12.5 MG/5ML PO ELIX: 12.5000 mg | ORAL_SOLUTION | ORAL | Status: DC | PRN

## 2022-06-03 MED ORDER — CHLORHEXIDINE GLUCONATE 0.12 % MT SOLN
OROMUCOSAL | Status: AC
  Administered 2022-06-03: 15 mL via OROMUCOSAL
  Filled 2022-06-03: qty 15

## 2022-06-03 MED ORDER — SODIUM CHLORIDE (PF) 0.9 % IJ SOLN
INTRAMUSCULAR | Status: DC | PRN
  Administered 2022-06-03: 120 mL via INTRAMUSCULAR

## 2022-06-03 MED ORDER — POLYVINYL ALCOHOL 1.4 % OP SOLN: 1.0000 [drp] | OPHTHALMIC | Status: DC | PRN

## 2022-06-03 MED ORDER — MIDAZOLAM HCL 5 MG/5ML IJ SOLN
INTRAMUSCULAR | Status: DC | PRN
  Administered 2022-06-03: 2 mg via INTRAVENOUS

## 2022-06-03 MED ORDER — TRANEXAMIC ACID-NACL 1000-0.7 MG/100ML-% IV SOLN
INTRAVENOUS | Status: AC
  Administered 2022-06-03: 1000 mg via INTRAVENOUS
  Filled 2022-06-03: qty 100

## 2022-06-03 MED ORDER — METOCLOPRAMIDE HCL 5 MG PO TABS
10.0000 mg | ORAL_TABLET | Freq: Three times a day (TID) | ORAL | Status: DC
  Administered 2022-06-03 – 2022-06-04 (×2): 10 mg via ORAL
  Filled 2022-06-03 (×2): qty 2

## 2022-06-03 MED ORDER — CEFAZOLIN SODIUM-DEXTROSE 2-4 GM/100ML-% IV SOLN
INTRAVENOUS | Status: AC
  Filled 2022-06-03: qty 100

## 2022-06-03 MED ORDER — DEXAMETHASONE SODIUM PHOSPHATE 10 MG/ML IJ SOLN
INTRAMUSCULAR | Status: AC
  Administered 2022-06-03: 8 mg via INTRAVENOUS
  Filled 2022-06-03: qty 1

## 2022-06-03 MED ORDER — CELECOXIB 200 MG PO CAPS
200.0000 mg | ORAL_CAPSULE | Freq: Two times a day (BID) | ORAL | Status: DC
Start: 2022-06-03 — End: 2022-06-04
  Administered 2022-06-03 – 2022-06-04 (×2): 200 mg via ORAL
  Filled 2022-06-03 (×2): qty 1

## 2022-06-03 MED ORDER — GABAPENTIN 300 MG PO CAPS
ORAL_CAPSULE | ORAL | Status: AC
  Administered 2022-06-03: 300 mg via ORAL
  Filled 2022-06-03: qty 1

## 2022-06-03 MED ORDER — BUPIVACAINE HCL (PF) 0.5 % IJ SOLN
INTRAMUSCULAR | Status: AC
  Filled 2022-06-03: qty 10

## 2022-06-03 MED ORDER — TRAMADOL HCL 50 MG PO TABS
50.0000 mg | ORAL_TABLET | ORAL | Status: DC | PRN
  Administered 2022-06-03: 100 mg via ORAL
  Filled 2022-06-03: qty 2
  Filled 2022-06-03: qty 1

## 2022-06-03 MED ORDER — GLYCOPYRROLATE 0.2 MG/ML IJ SOLN
INTRAMUSCULAR | Status: DC | PRN
  Administered 2022-06-03: .2 mg via INTRAVENOUS

## 2022-06-03 MED ORDER — ACETAMINOPHEN 10 MG/ML IV SOLN
INTRAVENOUS | Status: DC | PRN
  Administered 2022-06-03: 1000 mg via INTRAVENOUS

## 2022-06-03 MED ORDER — PROPOFOL 10 MG/ML IV BOLUS
INTRAVENOUS | Status: AC
  Filled 2022-06-03: qty 20

## 2022-06-03 MED ORDER — VITAMIN B-12 1000 MCG PO TABS
1000.0000 ug | ORAL_TABLET | Freq: Every day | ORAL | Status: DC
  Filled 2022-06-03 (×2): qty 1

## 2022-06-03 MED ORDER — PROPOFOL 500 MG/50ML IV EMUL
INTRAVENOUS | Status: DC | PRN
  Administered 2022-06-03: 50 ug/kg/min via INTRAVENOUS

## 2022-06-03 MED ORDER — ACETAMINOPHEN 10 MG/ML IV SOLN
1000.0000 mg | Freq: Four times a day (QID) | INTRAVENOUS | Status: DC
  Administered 2022-06-03 – 2022-06-04 (×3): 1000 mg via INTRAVENOUS
  Filled 2022-06-03 (×4): qty 100

## 2022-06-03 MED ORDER — ONDANSETRON HCL 4 MG/2ML IJ SOLN: 4.0000 mg | Freq: Four times a day (QID) | INTRAMUSCULAR | Status: DC | PRN

## 2022-06-03 MED ORDER — ACETAMINOPHEN 325 MG PO TABS: 325.0000 mg | ORAL_TABLET | Freq: Four times a day (QID) | ORAL | Status: DC | PRN

## 2022-06-03 MED ORDER — TRANEXAMIC ACID-NACL 1000-0.7 MG/100ML-% IV SOLN: 1000.0000 mg | Freq: Once | INTRAVENOUS | Status: AC

## 2022-06-03 MED ORDER — TRANEXAMIC ACID-NACL 1000-0.7 MG/100ML-% IV SOLN
INTRAVENOUS | Status: AC
  Filled 2022-06-03: qty 100

## 2022-06-03 MED ORDER — ACETAMINOPHEN 10 MG/ML IV SOLN
1000.0000 mg | Freq: Once | INTRAVENOUS | Status: DC | PRN
Start: 2022-06-03 — End: 2022-06-03

## 2022-06-03 MED ORDER — PROPYLENE GLYCOL 0.6 % OP SOLN: 1.0000 [drp] | Freq: Every day | OPHTHALMIC | Status: DC | PRN

## 2022-06-03 MED ORDER — BUPIVACAINE HCL (PF) 0.5 % IJ SOLN
INTRAMUSCULAR | Status: DC | PRN
  Administered 2022-06-03: 3 mL via INTRATHECAL

## 2022-06-03 MED ORDER — ENSURE PRE-SURGERY PO LIQD
296.0000 mL | Freq: Once | ORAL | Status: DC
  Filled 2022-06-03: qty 296

## 2022-06-03 MED ORDER — SENNOSIDES-DOCUSATE SODIUM 8.6-50 MG PO TABS
1.0000 | ORAL_TABLET | Freq: Two times a day (BID) | ORAL | Status: DC
  Administered 2022-06-03 – 2022-06-04 (×2): 1 via ORAL
  Filled 2022-06-03 (×2): qty 1

## 2022-06-03 MED ORDER — CELECOXIB 200 MG PO CAPS
ORAL_CAPSULE | ORAL | Status: AC
  Administered 2022-06-03: 400 mg via ORAL
  Filled 2022-06-03: qty 2

## 2022-06-03 MED ORDER — OXYCODONE HCL 5 MG/5ML PO SOLN: 5.0000 mg | Freq: Once | ORAL | Status: DC | PRN

## 2022-06-03 MED ORDER — 0.9 % SODIUM CHLORIDE (POUR BTL) OPTIME
TOPICAL | Status: DC | PRN
  Administered 2022-06-03: 500 mL

## 2022-06-03 MED ORDER — FAMOTIDINE 20 MG PO TABS
ORAL_TABLET | ORAL | Status: AC
  Administered 2022-06-03: 20 mg via ORAL
  Filled 2022-06-03: qty 1

## 2022-06-03 MED ORDER — MIDAZOLAM HCL 2 MG/2ML IJ SOLN
INTRAMUSCULAR | Status: AC
  Filled 2022-06-03: qty 2

## 2022-06-03 MED ORDER — ONDANSETRON HCL 4 MG PO TABS: 4.0000 mg | ORAL_TABLET | Freq: Four times a day (QID) | ORAL | Status: DC | PRN

## 2022-06-03 MED ORDER — SIMVASTATIN 20 MG PO TABS: 40.0000 mg | ORAL_TABLET | Freq: Every day | ORAL | Status: DC

## 2022-06-03 MED ORDER — PROPOFOL 1000 MG/100ML IV EMUL
INTRAVENOUS | Status: AC
  Filled 2022-06-03: qty 100

## 2022-06-03 MED ORDER — ACETAMINOPHEN 10 MG/ML IV SOLN
INTRAVENOUS | Status: AC
  Filled 2022-06-03: qty 100

## 2022-06-03 MED ORDER — SURGIPHOR WOUND IRRIGATION SYSTEM - OPTIME
TOPICAL | Status: DC | PRN
  Administered 2022-06-03: 1

## 2022-06-03 MED ORDER — PHENOL 1.4 % MT LIQD: 1.0000 | OROMUCOSAL | Status: DC | PRN

## 2022-06-03 MED ORDER — FENTANYL CITRATE (PF) 100 MCG/2ML IJ SOLN: 25.0000 ug | INTRAMUSCULAR | Status: DC | PRN

## 2022-06-03 MED ORDER — OXYCODONE HCL 5 MG PO TABS
5.0000 mg | ORAL_TABLET | ORAL | Status: DC | PRN
  Administered 2022-06-03 – 2022-06-04 (×2): 5 mg via ORAL
  Filled 2022-06-03 (×2): qty 1

## 2022-06-03 MED ORDER — ACETAMINOPHEN 500 MG PO TABS
ORAL_TABLET | ORAL | Status: AC
  Filled 2022-06-03: qty 2

## 2022-06-03 MED ORDER — CEFAZOLIN SODIUM-DEXTROSE 2-4 GM/100ML-% IV SOLN
2.0000 g | Freq: Four times a day (QID) | INTRAVENOUS | Status: AC
  Administered 2022-06-03 – 2022-06-04 (×2): 2 g via INTRAVENOUS
  Filled 2022-06-03 (×2): qty 100

## 2022-06-03 MED ORDER — FLEET ENEMA 7-19 GM/118ML RE ENEM: 1.0000 | ENEMA | Freq: Once | RECTAL | Status: DC | PRN

## 2022-06-03 MED ORDER — BISACODYL 10 MG RE SUPP: 10.0000 mg | Freq: Every day | RECTAL | Status: DC | PRN

## 2022-06-03 MED ORDER — SODIUM CHLORIDE 0.9 % IV SOLN: INTRAVENOUS | Status: DC

## 2022-06-03 MED ORDER — SODIUM CHLORIDE 0.9 % IR SOLN
Status: DC | PRN
  Administered 2022-06-03: 3000 mL

## 2022-06-03 MED ORDER — FERROUS SULFATE 325 (65 FE) MG PO TABS
325.0000 mg | ORAL_TABLET | Freq: Two times a day (BID) | ORAL | Status: DC
  Administered 2022-06-04: 325 mg via ORAL
  Filled 2022-06-03: qty 1

## 2022-06-03 MED ORDER — MENTHOL 3 MG MT LOZG: 1.0000 | LOZENGE | OROMUCOSAL | Status: DC | PRN

## 2022-06-03 MED ORDER — HYDROMORPHONE HCL 1 MG/ML IJ SOLN: 0.5000 mg | INTRAMUSCULAR | Status: DC | PRN

## 2022-06-03 MED ORDER — TAMSULOSIN HCL 0.4 MG PO CAPS
0.4000 mg | ORAL_CAPSULE | Freq: Every day | ORAL | Status: DC
  Filled 2022-06-03 (×2): qty 1

## 2022-06-03 MED ORDER — MAGNESIUM HYDROXIDE 400 MG/5ML PO SUSP
30.0000 mL | Freq: Every day | ORAL | Status: DC
Start: 2022-06-03 — End: 2022-06-04
  Administered 2022-06-04: 30 mL via ORAL
  Filled 2022-06-03 (×2): qty 30

## 2022-06-03 MED ORDER — PHENYLEPHRINE HCL-NACL 20-0.9 MG/250ML-% IV SOLN
INTRAVENOUS | Status: DC | PRN
  Administered 2022-06-03: 25 ug/min via INTRAVENOUS

## 2022-06-03 MED ORDER — FENTANYL CITRATE (PF) 100 MCG/2ML IJ SOLN
INTRAMUSCULAR | Status: DC | PRN
  Administered 2022-06-03: 25 ug via INTRAVENOUS
  Administered 2022-06-03: 50 ug via INTRAVENOUS
  Administered 2022-06-03: 25 ug via INTRAVENOUS

## 2022-06-03 MED ORDER — LORATADINE 10 MG PO TABS
10.0000 mg | ORAL_TABLET | Freq: Every day | ORAL | Status: DC
  Filled 2022-06-03 (×2): qty 1

## 2022-06-03 MED ORDER — OXYCODONE HCL 5 MG PO TABS
10.0000 mg | ORAL_TABLET | ORAL | Status: DC | PRN
  Administered 2022-06-04: 10 mg via ORAL
  Filled 2022-06-03: qty 2

## 2022-06-03 MED ORDER — FENTANYL CITRATE (PF) 100 MCG/2ML IJ SOLN
INTRAMUSCULAR | Status: AC
  Filled 2022-06-03: qty 2

## 2022-06-03 MED ORDER — ALUM & MAG HYDROXIDE-SIMETH 200-200-20 MG/5ML PO SUSP
30.0000 mL | ORAL | Status: DC | PRN
Start: 2022-06-03 — End: 2022-06-04

## 2022-06-03 MED ORDER — OXYCODONE HCL 5 MG PO TABS: 5.0000 mg | ORAL_TABLET | Freq: Once | ORAL | Status: DC | PRN

## 2022-06-03 MED ORDER — ENOXAPARIN SODIUM 30 MG/0.3ML IJ SOSY
30.0000 mg | PREFILLED_SYRINGE | Freq: Two times a day (BID) | INTRAMUSCULAR | Status: DC
Start: 2022-06-04 — End: 2022-06-04
  Administered 2022-06-04: 30 mg via SUBCUTANEOUS
  Filled 2022-06-03: qty 0.3

## 2022-06-03 SURGICAL SUPPLY — 77 items
ATTUNE PS FEM LT SZ 7 CEM KNEE (Femur) ×1 IMPLANT
ATTUNE PSRP INSR SZ7 5 KNEE (Insert) ×1 IMPLANT
BASE TIBIA ATTUNE KNEE SYS SZ6 (Knees) IMPLANT
BATTERY INSTRU NAVIGATION (MISCELLANEOUS) ×12 IMPLANT
BLADE CLIPPER SURG (BLADE) ×1 IMPLANT
BLADE SAW 70X12.5 (BLADE) ×3 IMPLANT
BLADE SAW 90X13X1.19 OSCILLAT (BLADE) ×3 IMPLANT
BLADE SAW 90X25X1.19 OSCILLAT (BLADE) ×3 IMPLANT
BSPLAT TIB 6 CMNT ROT PLAT STR (Knees) ×1 IMPLANT
BTRY SRG DRVR LF (MISCELLANEOUS) ×4
CEMENT HV SMART SET (Cement) ×2 IMPLANT
COOLER POLAR GLACIER W/PUMP (MISCELLANEOUS) ×3 IMPLANT
CUFF TOURN SGL QUICK 24 (TOURNIQUET CUFF)
CUFF TOURN SGL QUICK 34 (TOURNIQUET CUFF)
CUFF TRNQT CYL 24X4X16.5-23 (TOURNIQUET CUFF) IMPLANT
CUFF TRNQT CYL 34X4.125X (TOURNIQUET CUFF) IMPLANT
DRAPE 3/4 80X56 (DRAPES) ×3 IMPLANT
DRAPE INCISE IOBAN 66X45 STRL (DRAPES) IMPLANT
DRSG DERMACEA NONADH 3X8 (GAUZE/BANDAGES/DRESSINGS) ×3 IMPLANT
DRSG MEPILEX SACRM 8.7X9.8 (GAUZE/BANDAGES/DRESSINGS) ×3 IMPLANT
DRSG OPSITE POSTOP 4X14 (GAUZE/BANDAGES/DRESSINGS) ×3 IMPLANT
DRSG TEGADERM 4X4.75 (GAUZE/BANDAGES/DRESSINGS) ×3 IMPLANT
DURAPREP 26ML APPLICATOR (WOUND CARE) ×6 IMPLANT
ELECT CAUTERY BLADE 6.4 (BLADE) ×3 IMPLANT
ELECT REM PT RETURN 9FT ADLT (ELECTROSURGICAL) ×2
ELECTRODE REM PT RTRN 9FT ADLT (ELECTROSURGICAL) ×2 IMPLANT
EX-PIN ORTHOLOCK NAV 4X150 (PIN) ×6 IMPLANT
GLOVE BIO SURGEON STRL SZ7 (GLOVE) ×6 IMPLANT
GLOVE BIOGEL M STRL SZ7.5 (GLOVE) ×6 IMPLANT
GLOVE BIOGEL PI IND STRL 8 (GLOVE) ×2 IMPLANT
GLOVE BIOGEL PI INDICATOR 8 (GLOVE) ×1
GLOVE BIOGEL PI ORTHO PRO 7.5 (GLOVE) ×2
GLOVE PI ORTHO PRO STRL 7.5 (GLOVE) ×4 IMPLANT
GLOVE SURG UNDER POLY LF SZ7.5 (GLOVE) ×3 IMPLANT
GOWN STRL REUS W/ TWL LRG LVL3 (GOWN DISPOSABLE) ×4 IMPLANT
GOWN STRL REUS W/ TWL XL LVL3 (GOWN DISPOSABLE) ×2 IMPLANT
GOWN STRL REUS W/TWL LRG LVL3 (GOWN DISPOSABLE) ×4
GOWN STRL REUS W/TWL XL LVL3 (GOWN DISPOSABLE) ×2
HEMOVAC 400CC 10FR (MISCELLANEOUS) ×3 IMPLANT
HOLDER FOLEY CATH W/STRAP (MISCELLANEOUS) ×3 IMPLANT
HOLSTER ELECTROSUGICAL PENCIL (MISCELLANEOUS) ×3 IMPLANT
HOOD PEEL AWAY FLYTE STAYCOOL (MISCELLANEOUS) ×6 IMPLANT
IV NS IRRIG 3000ML ARTHROMATIC (IV SOLUTION) ×3 IMPLANT
KIT TURNOVER KIT A (KITS) ×3 IMPLANT
KNIFE SCULPS 14X20 (INSTRUMENTS) ×3 IMPLANT
MANIFOLD NEPTUNE II (INSTRUMENTS) ×6 IMPLANT
NDL SPNL 20GX3.5 QUINCKE YW (NEEDLE) ×4 IMPLANT
NEEDLE SPNL 20GX3.5 QUINCKE YW (NEEDLE) ×4 IMPLANT
NS IRRIG 500ML POUR BTL (IV SOLUTION) ×3 IMPLANT
PACK TOTAL KNEE (MISCELLANEOUS) ×3 IMPLANT
PAD ABD DERMACEA PRESS 5X9 (GAUZE/BANDAGES/DRESSINGS) ×6 IMPLANT
PAD WRAPON POLAR KNEE (MISCELLANEOUS) ×2 IMPLANT
PATELLA MEDIAL ATTUN 35MM KNEE (Knees) ×1 IMPLANT
PIN DRILL FIX HALF THREAD (BIT) ×6 IMPLANT
PIN FIXATION 1/8DIA X 3INL (PIN) ×3 IMPLANT
PULSAVAC PLUS IRRIG FAN TIP (DISPOSABLE) ×2
SOL PREP PVP 2OZ (MISCELLANEOUS) ×2
SOLUTION IRRIG SURGIPHOR (IV SOLUTION) ×3 IMPLANT
SOLUTION PREP PVP 2OZ (MISCELLANEOUS) ×2 IMPLANT
SPONGE DRAIN TRACH 4X4 STRL 2S (GAUZE/BANDAGES/DRESSINGS) ×3 IMPLANT
STAPLER SKIN PROX 35W (STAPLE) ×3 IMPLANT
STOCKINETTE IMPERV 14X48 (MISCELLANEOUS) ×1 IMPLANT
STRAP TIBIA SHORT (MISCELLANEOUS) ×3 IMPLANT
SUCTION FRAZIER HANDLE 10FR (MISCELLANEOUS) ×2
SUCTION TUBE FRAZIER 10FR DISP (MISCELLANEOUS) ×2 IMPLANT
SUT VIC AB 0 CT1 36 (SUTURE) ×6 IMPLANT
SUT VIC AB 1 CT1 36 (SUTURE) ×7 IMPLANT
SUT VIC AB 2-0 CT2 27 (SUTURE) ×3 IMPLANT
SYR 30ML LL (SYRINGE) ×6 IMPLANT
TIBIA ATTUNE KNEE SYS BASE SZ6 (Knees) ×2 IMPLANT
TIP FAN IRRIG PULSAVAC PLUS (DISPOSABLE) ×2 IMPLANT
TOWEL OR 17X26 4PK STRL BLUE (TOWEL DISPOSABLE) IMPLANT
TOWER CARTRIDGE SMART MIX (DISPOSABLE) ×3 IMPLANT
TRAY FOLEY MTR SLVR 16FR STAT (SET/KITS/TRAYS/PACK) ×3 IMPLANT
WATER STERILE IRR 1000ML POUR (IV SOLUTION) ×1 IMPLANT
WATER STERILE IRR 500ML POUR (IV SOLUTION) ×2 IMPLANT
WRAPON POLAR PAD KNEE (MISCELLANEOUS) ×2

## 2022-06-03 NOTE — Anesthesia Preprocedure Evaluation (Signed)
Anesthesia Evaluation  Patient identified by MRN, date of birth, ID band Patient awake    Reviewed: Allergy & Precautions, H&P , NPO status , Patient's Chart, lab work & pertinent test results, reviewed documented beta blocker date and time   History of Anesthesia Complications Negative for: history of anesthetic complications  Airway Mallampati: III  TM Distance: >3 FB Neck ROM: full    Dental no notable dental hx. (+) Teeth Intact   Pulmonary neg pulmonary ROS, neg sleep apnea, neg COPD, Patient abstained from smoking.Not current smoker,    Pulmonary exam normal breath sounds clear to auscultation       Cardiovascular Exercise Tolerance: Good METShypertension, On Medications (-) CAD and (-) Past MI Normal cardiovascular exam(-) dysrhythmias  Rhythm:regular Rate:Normal - Systolic murmurs    Neuro/Psych PSYCHIATRIC DISORDERS Depression  Neuromuscular disease    GI/Hepatic negative GI ROS, Neg liver ROS, neg GERD  ,  Endo/Other  negative endocrine ROSneg diabetes  Renal/GU negative Renal ROS  negative genitourinary   Musculoskeletal  (+) Arthritis , Osteoarthritis,    Abdominal   Peds  Hematology negative hematology ROS (+)   Anesthesia Other Findings Past Medical History: No date: Asthma     Comment:  as a child No date: BPH (benign prostatic hyperplasia) No date: Carpal tunnel syndrome No date: Hyperlipidemia 2019: Hypertension     Comment:  only took medication for a short time; no longer on               medication (wants removed from record) No date: Osteoarthritis Past Surgical History: No date: CARPAL TUNNEL RELEASE; Right     Comment:  1980's No date: COLONOSCOPY     Comment:  2013, 2016, 2022 11/26/2021: KNEE ARTHROSCOPY; Right     Comment:  Procedure: ARTHROSCOPY KNEE; MENISCECTOMY;               CHONDROPLASTY;  Surgeon: Donato Heinz, MD;  Location:               [facility] ORS;  Service:  Orthopedics;  Laterality: Right; No date: NASAL SINUS SURGERY No date: REFRACTIVE SURGERY; Bilateral     Comment:  Lasik 1960: TONSILLECTOMY BMI    Body Mass Index: 37.28 kg/m     Reproductive/Obstetrics negative OB ROS                             Anesthesia Physical  Anesthesia Plan  ASA: 2  Anesthesia Plan: Spinal   Post-op Pain Management: Ofirmev IV (intra-op)*, Gabapentin PO (pre-op)* and Celebrex PO (pre-op)*   Induction: Intravenous  PONV Risk Score and Plan: 2 and Ondansetron, Dexamethasone, Propofol infusion, TIVA and Midazolam  Airway Management Planned: Natural Airway  Additional Equipment: None  Intra-op Plan:   Post-operative Plan:   Informed Consent: I have reviewed the patients History and Physical, chart, labs and discussed the procedure including the risks, benefits and alternatives for the proposed anesthesia with the patient or authorized representative who has indicated his/her understanding and acceptance.     Dental Advisory Given  Plan Discussed with: CRNA  Anesthesia Plan Comments: (Discussed R/B/A of neuraxial anesthesia technique with patient: - rare risks of spinal/epidural hematoma, nerve damage, infection - Risk of PDPH - Risk of nausea and vomiting - Risk of conversion to general anesthesia and its associated risks, including sore throat, damage to lips/eyes/teeth/oropharynx, and rare risks such as cardiac and respiratory events. - Risk of allergic reactions  Discussed the  role of CRNA in patient's perioperative care.  Patient voiced understanding.)        Anesthesia Quick Evaluation

## 2022-06-03 NOTE — Anesthesia Procedure Notes (Signed)
Spinal  Patient location during procedure: OR Start time: 06/03/2022 1:03 PM End time: 06/03/2022 1:06 PM Reason for block: surgical anesthesia Staffing Performed: resident/CRNA  Resident/CRNA: Demetrius Charity, CRNA Performed by: Demetrius Charity, CRNA Authorized by: Arita Miss, MD   Preanesthetic Checklist Completed: patient identified, IV checked, site marked, risks and benefits discussed, surgical consent, monitors and equipment checked, pre-op evaluation and timeout performed Spinal Block Patient position: sitting Prep: ChloraPrep Patient monitoring: heart rate, continuous pulse ox, blood pressure and cardiac monitor Approach: midline Location: L3-4 Injection technique: single-shot Needle Needle type: Whitacre and Introducer  Needle gauge: 25 G Needle length: 9 cm Assessment Sensory level: T10 Events: CSF return Additional Notes Negative paresthesia. Negative blood return. Positive free-flowing CSF. Expiration date of kit checked and confirmed. Patient tolerated procedure well, without complications.

## 2022-06-03 NOTE — TOC Progression Note (Signed)
Transition of Care [facility]) - Progression Note    Patient Details  Name: Donald Kennedy MRN: [id number] Date of Birth: 27-Nov-1955  Transition of Care [facility]) CM/SW Contact  Marlowe Sax, RN Phone Number: 06/03/2022, 1:51 PM  Clinical Narrative:     [facility] accepted the patient for [facility] services prior to Surgery       Expected Discharge Plan and Services                                                 Social Determinants of Health (SDOH) Interventions    Readmission Risk Interventions     No data to display

## 2022-06-03 NOTE — Op Note (Signed)
OPERATIVE NOTE  DATE OF SURGERY:  06/03/2022  PATIENT NAME:  VOSHON PETRO   DOB: 05/12/1956  MRN: [id number]  PRE-OPERATIVE DIAGNOSIS: Degenerative arthrosis of the left knee, primary  POST-OPERATIVE DIAGNOSIS:  Same  PROCEDURE:  Left total knee arthroplasty using computer-assisted navigation  SURGEON:  Jena Gauss. M.D.  ASSISTANT: Baldwin Jamaica, PA-C (present and scrubbed throughout the case, critical for assistance with exposure, retraction, instrumentation, and closure)  ANESTHESIA: spinal  ESTIMATED BLOOD LOSS: 50 mL  FLUIDS REPLACED: 1400 mL of crystalloid  TOURNIQUET TIME: 108 minutes  DRAINS: 2 medium Hemovac drains  SOFT TISSUE RELEASES: Anterior cruciate ligament, posterior cruciate ligament, deep medial collateral ligament, patellofemoral ligament  IMPLANTS UTILIZED: DePuy Attune size 7 posterior stabilized femoral component (cemented), size 6 rotating platform tibial component (cemented), 35 mm medialized dome patella (cemented), and a 5 mm stabilized rotating platform polyethylene insert.  INDICATIONS FOR SURGERY: JESSEN SIEGMAN is a 66 y.o. year old male with a long history of progressive knee pain. X-rays demonstrated severe degenerative changes in tricompartmental fashion. The patient had not seen any significant improvement despite conservative nonsurgical intervention. After discussion of the risks and benefits of surgical intervention, the patient expressed understanding of the risks benefits and agree with plans for total knee arthroplasty.   The risks, benefits, and alternatives were discussed at length including but not limited to the risks of infection, bleeding, nerve injury, stiffness, blood clots, the need for revision surgery, cardiopulmonary complications, among others, and they were willing to proceed.  PROCEDURE IN DETAIL: The patient was brought into the operating room and, after adequate spinal anesthesia was achieved, a tourniquet was placed on  the patient's upper thigh. The patient's knee and leg were cleaned and prepped with alcohol and DuraPrep and draped in the usual sterile fashion. A "timeout" was performed as per usual protocol. The lower extremity was exsanguinated using an Esmarch, and the tourniquet was inflated to 300 mmHg. An anterior longitudinal incision was made followed by a standard mid vastus approach. The deep fibers of the medial collateral ligament were elevated in a subperiosteal fashion off of the medial flare of the tibia so as to maintain a continuous soft tissue sleeve. The patella was subluxed laterally and the patellofemoral ligament was incised. Inspection of the knee demonstrated severe degenerative changes with full-thickness loss of articular cartilage. Osteophytes were debrided using a rongeur. Anterior and posterior cruciate ligaments were excised. Two 4.0 mm Schanz pins were inserted in the femur and into the tibia for attachment of the array of trackers used for computer-assisted navigation. Hip center was identified using a circumduction technique. Distal landmarks were mapped using the computer. The distal femur and proximal tibia were mapped using the computer. The distal femoral cutting guide was positioned using computer-assisted navigation so as to achieve a 5 distal valgus cut. The femur was sized and it was felt that a size 7 femoral component was appropriate. A size 7 femoral cutting guide was positioned and the anterior cut was performed and verified using the computer. This was followed by completion of the posterior and chamfer cuts. Femoral cutting guide for the central box was then positioned in the center box cut was performed.  Attention was then directed to the proximal tibia. Medial and lateral menisci were excised. The extramedullary tibial cutting guide was positioned using computer-assisted navigation so as to achieve a 0 varus-valgus alignment and 3 posterior slope. The cut was performed and  verified using the computer. The proximal tibia was  sized and it was felt that a size 6 tibial tray was appropriate. Tibial and femoral trials were inserted followed by insertion of a 5 mm polyethylene insert. This allowed for excellent mediolateral soft tissue balancing both in flexion and in full extension. Finally, the patella was cut and prepared so as to accommodate a 35 mm medialized dome patella. A patella trial was placed and the knee was placed through a range of motion with excellent patellar tracking appreciated. The femoral trial was removed after debridement of posterior osteophytes. The central post-hole for the tibial component was reamed followed by insertion of a keel punch. Tibial trials were then removed. Cut surfaces of bone were irrigated with copious amounts of normal saline using pulsatile lavage and then suctioned dry. Polymethylmethacrylate cement was prepared in the usual fashion using a vacuum mixer. Cement was applied to the cut surface of the proximal tibia as well as along the undersurface of a size 6 rotating platform tibial component. Tibial component was positioned and impacted into place. Excess cement was removed using Personal assistant. Cement was then applied to the cut surfaces of the femur as well as along the posterior flanges of the size 7 femoral component. The femoral component was positioned and impacted into place. Excess cement was removed using Personal assistant. A 5 mm polyethylene trial was inserted and the knee was brought into full extension with steady axial compression applied. Finally, cement was applied to the backside of a 35 mm medialized dome patella and the patellar component was positioned and patellar clamp applied. Excess cement was removed using Personal assistant. After adequate curing of the cement, the tourniquet was deflated after a total tourniquet time of 108 minutes. Hemostasis was achieved using electrocautery. The knee was irrigated with copious  amounts of normal saline using pulsatile lavage followed by 450 ml of Surgiphor and then suctioned dry. 20 mL of 1.3% Exparel and 60 mL of 0.25% Marcaine in 40 mL of normal saline was injected along the posterior capsule, medial and lateral gutters, and along the arthrotomy site. A 5 mm stabilized rotating platform polyethylene insert was inserted and the knee was placed through a range of motion with excellent mediolateral soft tissue balancing appreciated and excellent patellar tracking noted. 2 medium drains were placed in the wound bed and brought out through separate stab incisions. The medial parapatellar portion of the incision was reapproximated using interrupted sutures of #1 Vicryl. Subcutaneous tissue was approximated in layers using first #0 Vicryl followed #2-0 Vicryl. The skin was approximated with skin staples. A sterile dressing was applied.  The patient tolerated the procedure well and was transported to the recovery room in stable condition.    Teirra Carapia P. Angie Fava., M.D.

## 2022-06-03 NOTE — Transfer of Care (Signed)
Immediate Anesthesia Transfer of Care Note  Patient: Donald Kennedy  Procedure(s) Performed: COMPUTER ASSISTED TOTAL KNEE ARTHROPLASTY (Left: Knee)  Patient Location: PACU  Anesthesia Type:spinal  Level of Consciousness: awake, alert  and oriented  Airway & Oxygen Therapy: Patient Spontanous Breathing  Post-op Assessment: Report given to RN and Post -op Vital signs reviewed and stable  Post vital signs: Reviewed and stable  Last Vitals:  Vitals Value Taken Time  BP 107/70 06/03/22 1710  Temp 37 C 06/03/22 1710  Pulse 60 06/03/22 1711  Resp 22 06/03/22 1711  SpO2 95 % 06/03/22 1711  Vitals shown include unvalidated device data.  Last Pain:  Vitals:   06/03/22 1710  TempSrc:   PainSc: 0-No pain         Complications: No notable events documented.

## 2022-06-03 NOTE — H&P (Signed)
The patient has been re-examined, and the chart reviewed, and there have been no interval changes to the documented history and physical.    The risks, benefits, and alternatives have been discussed at length. The patient expressed understanding of the risks benefits and agreed with plans for surgical intervention.  Analayah Brooke P. Kenson Groh, Jr. M.D.    

## 2022-06-04 DIAGNOSIS — M1712 Unilateral primary osteoarthritis, left knee: Secondary | ICD-10-CM | POA: Diagnosis not present

## 2022-06-04 MED ORDER — OXYCODONE HCL 5 MG PO TABS: 5.0000 mg | ORAL_TABLET | ORAL | 0 refills | Status: DC | PRN

## 2022-06-04 MED ORDER — ENOXAPARIN SODIUM 40 MG/0.4ML IJ SOSY: 40.0000 mg | PREFILLED_SYRINGE | INTRAMUSCULAR | 0 refills | Status: DC

## 2022-06-04 MED ORDER — CELECOXIB 200 MG PO CAPS: 200.0000 mg | ORAL_CAPSULE | Freq: Two times a day (BID) | ORAL | 0 refills | Status: AC

## 2022-06-04 MED ORDER — TRAMADOL HCL 50 MG PO TABS: 50.0000 mg | ORAL_TABLET | ORAL | 0 refills | Status: AC | PRN

## 2022-06-04 NOTE — Progress Notes (Signed)
Reviewed discharge instructions with pt. Wife at bedside. Pt verbalized understanding. Pt discharged home with extra honeycomb dressing, polar care, and compression socks  on BLE. IV intact upon removal. Staff wheeled pt out. Pt transported to home via family transportation.

## 2022-06-04 NOTE — Anesthesia Postprocedure Evaluation (Signed)
Anesthesia Post Note  Patient: Donald Kennedy  Procedure(s) Performed: COMPUTER ASSISTED TOTAL KNEE ARTHROPLASTY (Left: Knee)  Patient location during evaluation: Nursing Unit Anesthesia Type: Spinal Level of consciousness: oriented and awake and alert Pain management: pain level controlled Vital Signs Assessment: post-procedure vital signs reviewed and stable Respiratory status: spontaneous breathing and respiratory function stable Cardiovascular status: blood pressure returned to baseline and stable Postop Assessment: no headache, no backache, no apparent nausea or vomiting, patient able to bend at knees and able to ambulate Anesthetic complications: no   No notable events documented.   Last Vitals:  Vitals:   06/04/22 0445 06/04/22 0802  BP: 111/68 120/68  Pulse: (!) 51 (!) 51  Resp: 16   Temp: (!) 36.4 C (!) 36.3 C  SpO2: 95% 100%    Last Pain:  Vitals:   06/04/22 0802  TempSrc: Oral  PainSc:                  Rosanne Gutting

## 2022-06-04 NOTE — Evaluation (Signed)
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   Clinical Impression   Pt seen for OT evaluation this date, POD#1 from above surgery. Pt was independent in all ADL prior to surgery and is eager to return to PLOF with less pain and improved safety and independence. Pt currently requires PRN minimal assist for LB dressing and bathing while in seated position due to pain and limited AROM of L knee. Spouse present and able to provide assist as needed upon discharge. Pt/spouse instructed in polar care mgt, falls prevention strategies, home/routines modifications, DME/AE for LB bathing and dressing tasks, and compression stocking mgt. Handout provided to support recall and carryover. Pt/spouse verbalize understanding, no additional skilled OT needs at this time. Will sign off.       Recommendations for follow up therapy are one component of a multi-disciplinary discharge planning process, led by the attending physician.  Recommendations may be updated based on patient status, additional functional criteria and insurance authorization.   Follow Up Recommendations  No OT follow up    Assistance Recommended at Discharge PRN  Patient can return home with the following A little help with bathing/dressing/bathroom;Assistance with cooking/housework;Assist for transportation;Help with stairs or ramp for entrance    Functional Status Assessment  Patient has had a recent decline in their functional status and demonstrates the ability to make significant improvements in function in a reasonable and predictable amount of time.  Equipment Recommendations  None recommended by OT    Recommendations for Other Services       Precautions / Restrictions Precautions Precautions: Fall Restrictions Weight Bearing Restrictions: Yes LLE Weight  Bearing: Weight bearing as tolerated      Mobility Bed Mobility               General bed mobility comments: NT up in recliner    Transfers Overall transfer level: Modified independent Equipment used: Rolling walker (2 wheels)                      Balance Overall balance assessment: Mild deficits observed, not formally tested                                         ADL either performed or assessed with clinical judgement   ADL                                         General ADL Comments: Pt requires PRN MIN A for LB ADL tasks 2/2 decreased strength/ROM in L knee post-op. Spouse present and able to provide needed assist at home.     Vision         Perception     Praxis      Pertinent Vitals/Pain Pain Assessment Pain Assessment: 0-10 Pain Score: 2  Pain Location: L knee Pain Descriptors / Indicators: Aching Pain Intervention(s): Monitored during session, Premedicated before session, Repositioned, Ice applied     Hand Dominance     Extremity/Trunk Assessment Upper Extremity Assessment Upper Extremity Assessment: Overall WFL for tasks assessed   Lower Extremity Assessment Lower Extremity Assessment: LLE deficits/detail LLE Deficits / Details:  expected L TKA deficits in strength/ROM       Communication Communication Communication: No difficulties   Cognition Arousal/Alertness: Awake/alert Behavior During Therapy: WFL for tasks assessed/performed Overall Cognitive Status: Within Functional Limits for tasks assessed                                       General Comments       Exercises Other Exercises Other Exercises: Pt/spouse instructed in home/routines modifications, falls prevention, compression stocking mgt, polar care mgt, and AE/DME. Handout provided.   Shoulder Instructions      Home Living Family/patient expects to be discharged to:: Private residence Living Arrangements:  Spouse/significant other Available Help at Discharge: Family;Available 24 hours/day Type of Home: House Home Access: Stairs to enter [employer] of Steps: 4 Entrance Stairs-Rails: Right Home Layout: Two level;Able to live on main level with bedroom/bathroom     Bathroom Shower/Tub: Walk-in shower   Bathroom Toilet: Handicapped height     Home Equipment: Control and instrumentation engineer (2 wheels)          Prior Functioning/Environment Prior Level of Function : Independent/Modified Independent             Mobility Comments: Ind amb community distances without an AD, no fall history ADLs Comments: Ind with ADLs        OT Problem List: Decreased strength;Decreased range of motion;Pain      OT Treatment/Interventions:      OT Goals(Current goals can be found in the care plan section) Acute Rehab OT Goals Patient Stated Goal: go home OT Goal Formulation: All assessment and education complete, DC therapy  OT Frequency:      Co-evaluation              AM-PAC OT "6 Clicks" Daily Activity     Outcome Measure Help from another person eating meals?: None Help from another person taking care of personal grooming?: None Help from another person toileting, which includes using toliet, bedpan, or urinal?: None Help from another person bathing (including washing, rinsing, drying)?: A Little Help from another person to put on and taking off regular upper body clothing?: None Help from another person to put on and taking off regular lower body clothing?: A Little 6 Click Score: 22   End of Session    Activity Tolerance: Patient tolerated treatment well Patient left: in chair;with call bell/phone within reach;with family/visitor present;Other (comment) (rolled towel under L ankle, polar care in place)  OT Visit Diagnosis: Other abnormalities of gait and mobility (R26.89)                Time: 7616-0737 OT Time Calculation (min): 11 min Charges:  OT  General Charges $OT Visit: 1 Visit OT Evaluation $OT Eval Low Complexity: 1 Low  Arman Filter., MPH, MS, OTR/L ascom [phone] 06/04/22, 9:35 AM

## 2022-06-04 NOTE — Evaluation (Signed)
Physical Therapy Evaluation Patient Details Name: Donald Kennedy MRN: [id number] DOB: 1956/07/31 Today's Date: 06/04/2022  History of Present Illness  66yo male with PMH of HTN, R TKA 3/23, now s/p L TKA on 06/03/22.  Clinical Impression  Pt admitted with above diagnosis. Pt received upright in bed agreeable to PT. Reports independent at baseline with mobility and ADL's/IADL's. Education provided on HEP (reps/sets/frequency) performing well with min VC's for understanding. L knee AROM noted 0-88 degrees. Pt mod-I with bed mobility and standing to RW with supervision and min VC's for hand placement. Able to ambulate 2 laps around  nurses station with reciprocal gait and consistent heel strike bilaterally. Also completing stairs with min VC's for correct LE sequencing but safe with performance only needing supervision. Pt returning to recliner with all needs in place. Pt safe to d/c home with HHPT and family support. Will continue per ortho POC with BID but anticipate pt will d/c this afternoon pending medical status. Pt currently with functional limitations due to the deficits listed below (see PT Problem List). Pt will benefit from skilled PT to increase their independence and safety with mobility to allow discharge to the venue listed below.      Recommendations for follow up therapy are one component of a multi-disciplinary discharge planning process, led by the attending physician.  Recommendations may be updated based on patient status, additional functional criteria and insurance authorization.  Follow Up Recommendations Home health PT      Assistance Recommended at Discharge None  Patient can return home with the following  A little help with walking and/or transfers;Assist for transportation;Help with stairs or ramp for entrance    Equipment Recommendations None recommended by PT  Recommendations for Other Services       Functional Status Assessment Patient has had a recent decline in  their functional status and demonstrates the ability to make significant improvements in function in a reasonable and predictable amount of time.     Precautions / Restrictions Precautions Precautions: Fall;Knee Precaution Booklet Issued: Yes (comment) Restrictions Weight Bearing Restrictions: Yes LLE Weight Bearing: Weight bearing as tolerated      Mobility  Bed Mobility Overal bed mobility: Modified Independent               Patient Response: Cooperative  Transfers Overall transfer level: Modified independent Equipment used: Rolling walker (2 wheels)               General transfer comment: min VC's for initial hand placement    Ambulation/Gait Ambulation/Gait assistance: Supervision Gait Distance (Feet): 340 Feet Assistive device: Rolling walker (2 wheels) Gait Pattern/deviations: Step-through pattern, WFL(Within Functional Limits)   Gait velocity interpretation: 1.31 - 2.62 ft/sec, indicative of limited community ambulator   General Gait Details: consistent step through with BLE heel strike throughout  Stairs Stairs: Yes Stairs assistance: Supervision Stair Management: One rail Right, Step to pattern, Forwards Number of Stairs: 8 (4 stairs x2 bouts) General stair comments: VC's for correct LE sequencing. Safe performance throughout.  Wheelchair Mobility    Modified Rankin (Stroke Patients Only)       Balance Overall balance assessment: Mild deficits observed, not formally tested                                           Pertinent Vitals/Pain Pain Assessment Pain Assessment: Faces Faces Pain Scale: Hurts a little bit  Pain Location: L knee Pain Descriptors / Indicators: Aching Pain Intervention(s): Limited activity within patient's tolerance, Monitored during session, Premedicated before session, Repositioned    Home Living Family/patient expects to be discharged to:: Private residence Living Arrangements:  Spouse/significant other Available Help at Discharge: Family;Available 24 hours/day Type of Home: House Home Access: Stairs to enter Entrance Stairs-Rails: Right Entrance Stairs-Number of Steps: 4   Home Layout: Two level;Able to live on main level with bedroom/bathroom Home Equipment: Shower seat;BSC/3in1;Rolling Walker (2 wheels)      Prior Function Prior Level of Function : Independent/Modified Independent             Mobility Comments: Ind amb community distances without an AD, no fall history ADLs Comments: Ind with ADLs     Hand Dominance        Extremity/Trunk Assessment   Upper Extremity Assessment Upper Extremity Assessment: Overall WFL for tasks assessed    Lower Extremity Assessment Lower Extremity Assessment: LLE deficits/detail LLE Deficits / Details: expected L TKA deficits in strength/ROM    Cervical / Trunk Assessment Cervical / Trunk Assessment: Normal  Communication   Communication: No difficulties  Cognition Arousal/Alertness: Awake/alert Behavior During Therapy: WFL for tasks assessed/performed Overall Cognitive Status: Within Functional Limits for tasks assessed                                          General Comments      Exercises Total Joint Exercises Ankle Circles/Pumps: AROM, Strengthening, Both, 10 reps, Supine Quad Sets: AROM, Strengthening, Left, 10 reps, Supine Short Arc Quad: AROM, Strengthening, Left, 10 reps, Supine Heel Slides: AROM, Strengthening, Left, 5 reps, Supine Hip ABduction/ADduction: AROM, Strengthening, Left, 10 reps, Supine Straight Leg Raises: AROM, Strengthening, Left, 10 reps, Supine Long Arc Quad: AROM, Strengthening, Left, 10 reps, Seated Goniometric ROM: 0-88 degrees Other Exercises Other Exercises: Role of PT in acute setting, d/c recs, safe use of RW, stairs, exercise program (reps/sets/frequency), WB precautions   Assessment/Plan    PT Assessment Patient needs continued PT  services  PT Problem List Decreased mobility;Decreased range of motion       PT Treatment Interventions DME instruction;Therapeutic exercise;Gait training;Balance training;Stair training;Neuromuscular re-education;Functional mobility training;Therapeutic activities;Patient/family education    PT Goals (Current goals can be found in the Care Plan section)  Acute Rehab PT Goals Patient Stated Goal: to go home PT Goal Formulation: With patient/family Time For Goal Achievement: 06/18/22 Potential to Achieve Goals: Good    Frequency BID     Co-evaluation               AM-PAC PT "6 Clicks" Mobility  Outcome Measure Help needed turning from your back to your side while in a flat bed without using bedrails?: None Help needed moving from lying on your back to sitting on the side of a flat bed without using bedrails?: None Help needed moving to and from a bed to a chair (including a wheelchair)?: A Little Help needed standing up from a chair using your arms (e.g., wheelchair or bedside chair)?: A Little Help needed to walk in hospital room?: A Little Help needed climbing 3-5 steps with a railing? : A Little 6 Click Score: 20    End of Session Equipment Utilized During Treatment: Gait belt Activity Tolerance: Patient tolerated treatment well Patient left: in chair;with call bell/phone within reach;with family/visitor present Nurse Communication: Mobility status PT Visit Diagnosis:  Other abnormalities of gait and mobility (R26.89)    Time: 6195-0932 PT Time Calculation (min) (ACUTE ONLY): 27 min   Charges:   PT Evaluation $PT Eval Low Complexity: 1 Low PT Treatments $Therapeutic Exercise: 8-22 mins       Wendee Hata M. Fairly IV, PT, DPT Physical Therapist- [place]  [facility]  06/04/2022, 10:00 AM

## 2022-06-04 NOTE — Progress Notes (Signed)
  Subjective: 1 Day Post-Op Procedure(s) (LRB): COMPUTER ASSISTED TOTAL KNEE ARTHROPLASTY (Left) Patient reports pain as well-controlled.   Patient is well, and has had no acute complaints or problems Plan is to go Home after hospital stay. Negative for chest pain and shortness of breath Fever: no Gastrointestinal: negative for nausea and vomiting.    Objective: Vital signs in last 24 hours: Temp:  [97.4 F (36.3 C)-98.6 F (37 C)] 97.4 F (36.3 C) (08/03 0802) Pulse Rate:  [51-69] 51 (08/03 0802) Resp:  [12-18] 16 (08/03 0445) BP: (103-154)/(67-79) 120/68 (08/03 0802) SpO2:  [92 %-100 %] 100 % (08/03 0802) Weight:  [107.7 kg] 107.7 kg (08/02 1044)  Intake/Output from previous day:  Intake/Output Summary (Last 24 hours) at 06/04/2022 0824 Last data filed at 06/04/2022 0553 Gross per 24 hour  Intake 1500 ml  Output 1680 ml  Net -180 ml    Intake/Output this shift: No intake/output data recorded.  Labs: No results for input(s): "HGB" in the last 72 hours. No results for input(s): "WBC", "RBC", "HCT", "PLT" in the last 72 hours. No results for input(s): "NA", "K", "CL", "CO2", "BUN", "CREATININE", "GLUCOSE", "CALCIUM" in the last 72 hours. No results for input(s): "LABPT", "INR" in the last 72 hours.   EXAM General - Patient is Alert, Appropriate, and Oriented Extremity - Neurovascular intact Dorsiflexion/Plantar flexion intact Compartment soft Dressing/Incision -Postoperative dressing remains in place., Polar Care in place and working. , Hemovac in place.  Motor Function - intact, moving foot and toes well on exam.  Cardiovascular- Regular rate and rhythm, no murmurs/rubs/gallops Respiratory- Lungs clear to auscultation bilaterally Gastrointestinal- soft, nontender, and active bowel sounds   Assessment/Plan: 1 Day Post-Op Procedure(s) (LRB): COMPUTER ASSISTED TOTAL KNEE ARTHROPLASTY (Left) Principal Problem:   Total knee replacement status  Estimated body mass  index is 37.19 kg/m as calculated from the following:   Height as of this encounter: 5\' 7"  (1.702 m).   Weight as of this encounter: 107.7 kg. Advance diet Up with therapy  Likely d/c this PM pending completion of therapy goals.      DVT Prophylaxis - Lovenox, Ted hose, and SCDs Weight-Bearing as tolerated to left leg  , PA-C [facility] Orthopaedic Surgery 06/04/2022, 8:24 AM

## 2022-06-04 NOTE — Plan of Care (Signed)
Problem: Education: Goal: Knowledge of General Education information will improve Description: Including pain rating scale, medication(s)/side effects and non-pharmacologic comfort measures 06/04/2022 1315 by Mirelle Biskup Bet, LPN Outcome: Adequate for Discharge 06/04/2022 1315 by Olayinka Gathers Bet, LPN Outcome: Progressing 06/04/2022 0900 by Tiphany Fayson Bet, LPN Outcome: Progressing 06/04/2022 0900 by Goldman Birchall Bet, LPN Outcome: Progressing   Problem: Health Behavior/Discharge Planning: Goal: Ability to manage health-related needs will improve 06/04/2022 1315 by Leeanne Butters Bet, LPN Outcome: Adequate for Discharge 06/04/2022 1315 by Makar Slatter Bet, LPN Outcome: Progressing 06/04/2022 0900 by Ailine Hefferan Bet, LPN Outcome: Progressing 06/04/2022 0900 by Linnell Swords Bet, LPN Outcome: Progressing   Problem: Clinical Measurements: Goal: Ability to maintain clinical measurements within normal limits will improve 06/04/2022 1315 by Alvie Speltz Bet, LPN Outcome: Adequate for Discharge 06/04/2022 1315 by Tola Meas Bet, LPN Outcome: Progressing 06/04/2022 0900 by Chalet Kerwin Bet, LPN Outcome: Progressing 06/04/2022 0900 by Chandel Zaun Bet, LPN Outcome: Progressing Goal: Will remain free from infection 06/04/2022 1315 by Kioni Stahl Bet, LPN Outcome: Adequate for Discharge 06/04/2022 1315 by Acy Orsak Bet, LPN Outcome: Progressing 06/04/2022 0900 by Sander Speckman Bet, LPN Outcome: Progressing 06/04/2022 0900 by Mavis Fichera Bet, LPN Outcome: Progressing Goal: Diagnostic test results will improve 06/04/2022 1315 by Faiz Weber Bet, LPN Outcome: Adequate for Discharge 06/04/2022 1315 by Corazon Nickolas Bet, LPN Outcome: Progressing 06/04/2022 0900 by Meshilem Machuca Bet, LPN Outcome: Progressing 06/04/2022 0900 by Gonzalo Waymire Bet, LPN Outcome: Progressing Goal: Respiratory complications will improve 06/04/2022 1315 by Mahati Vajda Bet, LPN Outcome: Adequate for Discharge 06/04/2022 1315 by Eutimio Gharibian Bet, LPN Outcome: Progressing 06/04/2022  0900 by Renea Schoonmaker Bet, LPN Outcome: Progressing 06/04/2022 0900 by Devon Kingdon Bet, LPN Outcome: Progressing Goal: Cardiovascular complication will be avoided 06/04/2022 1315 by Chantel Teti Bet, LPN Outcome: Adequate for Discharge 06/04/2022 1315 by Lorenza Shakir Bet, LPN Outcome: Progressing 06/04/2022 0900 by Monterio Bob Bet, LPN Outcome: Progressing 06/04/2022 0900 by Esdras Delair Bet, LPN Outcome: Progressing   Problem: Activity: Goal: Risk for activity intolerance will decrease 06/04/2022 1315 by Rosalie Gelpi Bet, LPN Outcome: Adequate for Discharge 06/04/2022 1315 by Denette Hass Bet, LPN Outcome: Progressing 06/04/2022 0900 by Santasia Rew Bet, LPN Outcome: Progressing 06/04/2022 0900 by Isella Slatten Bet, LPN Outcome: Progressing   Problem: Nutrition: Goal: Adequate nutrition will be maintained 06/04/2022 1315 by Leelynn Whetsel Bet, LPN Outcome: Adequate for Discharge 06/04/2022 1315 by Deniese Oberry Bet, LPN Outcome: Progressing 06/04/2022 0900 by Ramzey Petrovic Bet, LPN Outcome: Progressing 06/04/2022 0900 by Dusty Raczkowski Bet, LPN Outcome: Progressing   Problem: Coping: Goal: Level of anxiety will decrease 06/04/2022 1315 by Alucard Fearnow Bet, LPN Outcome: Adequate for Discharge 06/04/2022 1315 by Luisfernando Brightwell Bet, LPN Outcome: Progressing 06/04/2022 0900 by Lory Nowaczyk Bet, LPN Outcome: Progressing 06/04/2022 0900 by Donnah Levert Bet, LPN Outcome: Progressing   Problem: Elimination: Goal: Will not experience complications related to bowel motility 06/04/2022 1315 by Janera Peugh Bet, LPN Outcome: Adequate for Discharge 06/04/2022 1315 by Alette Kataoka Bet, LPN Outcome: Progressing 06/04/2022 0900 by Laurine Kuyper Bet, LPN Outcome: Progressing 06/04/2022 0900 by Zyairah Wacha Bet, LPN Outcome: Progressing Goal: Will not experience complications related to urinary retention 06/04/2022 1315 by Garan Frappier Bet, LPN Outcome: Adequate for Discharge 06/04/2022 1315 by Filimon Miranda Bet, LPN Outcome: Progressing 06/04/2022 0900 by Loretto Belinsky Bet,  LPN Outcome: Progressing 06/04/2022 0900 by Nathalie Cavendish Bet, LPN Outcome: Progressing   Problem: Pain Managment: Goal: General experience of comfort will improve 06/04/2022 1315 by  Thomasina Housley Bet, LPN Outcome: Adequate for Discharge 06/04/2022 1315 by Emmauel Hallums Bet, LPN Outcome: Progressing 06/04/2022 0900 by Coen Miyasato Bet, LPN Outcome: Progressing 06/04/2022 0900 by Zahava Quant Bet, LPN Outcome: Progressing   Problem: Safety: Goal: Ability to remain free from injury will improve 06/04/2022 1315 by Reon Hunley Bet, LPN Outcome: Adequate for Discharge 06/04/2022 1315 by Jolissa Kapral Bet, LPN Outcome: Progressing 06/04/2022 0900 by Irma Roulhac Bet, LPN Outcome: Progressing 06/04/2022 0900 by Aryianna Earwood Bet, LPN Outcome: Progressing   Problem: Skin Integrity: Goal: Risk for impaired skin integrity will decrease 06/04/2022 1315 by Elvira Langston Bet, LPN Outcome: Adequate for Discharge 06/04/2022 1315 by Sayf Kerner Bet, LPN Outcome: Progressing 06/04/2022 0900 by Isaiha Asare Bet, LPN Outcome: Progressing 06/04/2022 0900 by Saathvik Every Bet, LPN Outcome: Progressing   Problem: Acute Rehab PT Goals(only PT should resolve) Goal: Pt Will Ambulate Outcome: Adequate for Discharge Goal: Pt Will Go Up/Down Stairs Outcome: Adequate for Discharge Goal: Pt/caregiver will Perform Home Exercise Program Outcome: Adequate for Discharge

## 2022-06-04 NOTE — Care Management Obs Status (Signed)
MEDICARE OBSERVATION STATUS NOTIFICATION   Patient Details  Name: YISHAI REHFELD MRN: [id number] Date of Birth: 1956-10-18   Medicare Observation Status Notification Given:  Yes    Marlowe Sax, RN 06/04/2022, 8:45 AM

## 2022-06-04 NOTE — Progress Notes (Signed)
Met with the patient and his wife in the room at the bedside He lives at home with his wife He had previous surgery in March and has DME at home, His wife provides transportation He is set up with [facility] for home health services MOON reviewed and provided a copy

## 2022-06-04 NOTE — Plan of Care (Signed)
  Problem: Education: Goal: Knowledge of General Education information will improve Description: Including pain rating scale, medication(s)/side effects and non-pharmacologic comfort measures Outcome: Progressing   Problem: Activity: Goal: Risk for activity intolerance will decrease Outcome: Progressing   Problem: Elimination: Goal: Will not experience complications related to bowel motility Outcome: Progressing   Problem: Elimination: Goal: Will not experience complications related to urinary retention Outcome: Progressing   Problem: Safety: Goal: Ability to remain free from injury will improve Outcome: Progressing   Problem: Skin Integrity: Goal: Risk for impaired skin integrity will decrease Outcome: Progressing   Problem: Pain Managment: Goal: General experience of comfort will improve Outcome: Progressing

## 2022-06-04 NOTE — Discharge Summary (Signed)
Physician Discharge Summary  Patient ID: Donald Kennedy MRN: [id number] DOB/AGE: 1955/11/12 66 y.o.  Admit date: 06/03/2022 Discharge date: 06/04/2022  Admission Diagnoses:  Total knee replacement status [Z96.659]  Surgeries:Procedure(s): Left total knee arthroplasty using computer-assisted navigation   SURGEON:  Marciano Sequin. M.D.   ASSISTANT: Cassell Smiles, PA-C (present and scrubbed throughout the case, critical for assistance with exposure, retraction, instrumentation, and closure)   ANESTHESIA: spinal   ESTIMATED BLOOD LOSS: 50 mL   FLUIDS REPLACED: 1400 mL of crystalloid   TOURNIQUET TIME: 108 minutes   DRAINS: 2 medium Hemovac drains   SOFT TISSUE RELEASES: Anterior cruciate ligament, posterior cruciate ligament, deep medial collateral ligament, patellofemoral ligament   IMPLANTS UTILIZED: DePuy Attune size 7 posterior stabilized femoral component (cemented), size 6 rotating platform tibial component (cemented), 35 mm medialized dome patella (cemented), and a 5 mm stabilized rotating platform polyethylene insert.  Discharge Diagnoses: Patient Active Problem List   Diagnosis Date Noted   Total knee replacement status 06/03/2022   Status post total right knee replacement 01/19/2022   Primary osteoarthritis of left knee 10/19/2021   Hyperlipidemia 11/20/2020   OA (osteoarthritis) 11/20/2020   BPH associated with nocturia 08/05/2020   Essential hypertension 08/05/2020   Major depression in remission ([place]) 01/02/2016    Past Medical History:  Diagnosis Date   Asthma    as a child   BPH (benign prostatic hyperplasia)    Carpal tunnel syndrome    right   Hyperlipidemia    Hypertension 2019   only took medication for a short time; no longer on medication (wants removed from record)   Osteoarthritis      Transfusion:    Consultants (if any):   Discharged Condition: Improved  Hospital Course: Donald Kennedy is an 66 y.o. male who was admitted 06/03/2022 with  a diagnosis of left knee osteoarthritis and went to the operating room on 06/03/2022 and underwent left total knee arthoplasty. The patient received perioperative antibiotics for prophylaxis (see below). The patient tolerated the procedure well and was transported to PACU in stable condition. After meeting PACU criteria, the patient was subsequently transferred to the Orthopaedics/Rehabilitation unit.   The patient received DVT prophylaxis in the form of early mobilization, Lovenox, TED hose, and SCDs . A sacral pad had been placed and heels were elevated off of the bed with rolled towels in order to protect skin integrity. Foley catheter was discontinued on postoperative day #0. Wound drains were discontinued on postoperative day #1. The surgical incision was healing well without signs of infection.  Physical therapy was initiated postoperatively for transfers, gait training, and strengthening. Occupational therapy was initiated for activities of daily living and evaluation for assisted devices. Rehabilitation goals were reviewed in detail with the patient. The patient made steady progress with physical therapy and physical therapy recommended discharge to Home.   The patient achieved the preliminary goals of this hospitalization and was felt to be medically and orthopaedically appropriate for discharge.  He was given perioperative antibiotics:  Anti-infectives (From admission, onward)    Start     Dose/Rate Route Frequency Ordered Stop   06/03/22 1900  ceFAZolin (ANCEF) IVPB 2g/100 mL premix        2 g 200 mL/hr over 30 Minutes Intravenous Every 6 hours 06/03/22 1802 06/04/22 0830   06/03/22 1029  ceFAZolin (ANCEF) 2-4 GM/100ML-% IVPB       Note to Pharmacy: Jeanene Erb E: cabinet override      06/03/22 1029 06/03/22  1316   06/03/22 0600  ceFAZolin (ANCEF) IVPB 2g/100 mL premix        2 g 200 mL/hr over 30 Minutes Intravenous On call to O.R. 06/02/22 2318 06/03/22 1315     .  Recent  vital signs:  Vitals:   06/04/22 0445 06/04/22 0802  BP: 111/68 120/68  Pulse: (!) 51 (!) 51  Resp: 16   Temp: (!) 97.5 F (36.4 C) (!) 97.4 F (36.3 C)  SpO2: 95% 100%    Recent laboratory studies:  No results for input(s): "WBC", "HGB", "HCT", "PLT", "K", "CL", "CO2", "BUN", "CREATININE", "GLUCOSE", "CALCIUM", "LABPT", "INR" in the last 72 hours.  Diagnostic Studies: DG Knee Left Port  Result Date: 06/03/2022 CLINICAL DATA:  Status post total arthroplasty EXAM: PORTABLE LEFT KNEE - 1-2 VIEW COMPARISON:  None Available. FINDINGS: Status post left knee total arthroplasty with expected overlying postoperative change. No evidence of perihardware fracture or component malpositioning. Evidence of prior tibial and femoral osteotomy. IMPRESSION: Status post left knee total arthroplasty with expected overlying postoperative change. No evidence of perihardware fracture or component malpositioning. Electronically Signed   By: Jearld Lesch M.D.   On: 06/03/2022 17:31    Discharge Medications:   Allergies as of 06/04/2022   No Known Allergies      Medication List     TAKE these medications    carboxymethylcellul-glycerin 0.5-0.9 % ophthalmic solution Commonly known as: REFRESH OPTIVE Apply 1-2 drops to eye as needed for dry eyes.   celecoxib 200 MG capsule Commonly known as: CELEBREX Take 1 capsule (200 mg total) by mouth 2 (two) times daily. What changed:  when to take this additional instructions   celecoxib 200 MG capsule Commonly known as: CELEBREX Take 1 capsule (200 mg total) by mouth 2 (two) times daily. What changed: You were already taking a medication with the same name, and this prescription was added. Make sure you understand how and when to take each.   cetirizine 10 MG tablet Commonly known as: ZYRTEC Take 10 mg by mouth at bedtime.   clobetasol cream 0.05 % Commonly known as: TEMOVATE Apply 1 application. topically 2 (two) times daily as needed.    cyanocobalamin 1000 MCG tablet Take 1,000 mcg by mouth daily.   enoxaparin 40 MG/0.4ML injection Commonly known as: LOVENOX Inject 0.4 mLs (40 mg total) into the skin daily for 14 days.   neomycin-bacitracin-polymyxin [phone] ointment Apply topically as needed.   oxyCODONE 5 MG immediate release tablet Commonly known as: Oxy IR/ROXICODONE Take 1 tablet (5 mg total) by mouth every 4 (four) hours as needed for severe pain.   simvastatin 40 MG tablet Commonly known as: ZOCOR Take 40 mg by mouth at bedtime.   Systane Complete 0.6 % Soln Generic drug: Propylene Glycol Place 1 drop into both eyes daily as needed (dry eyes).   tadalafil 10 MG tablet Commonly known as: Cialis Take 1 tablet (10 mg total) by mouth daily as needed for erectile dysfunction.   tamsulosin 0.4 MG Caps capsule Commonly known as: FLOMAX Take 1 capsule (0.4 mg total) by mouth daily.   traMADol 50 MG tablet Commonly known as: ULTRAM Take 1 tablet (50 mg total) by mouth every 4 (four) hours as needed for moderate pain.   trolamine salicylate 10 % cream Commonly known as: ASPERCREME Apply 1 application topically as needed for muscle pain.               Durable Medical Equipment  (From admission, onward)  Start     Ordered   06/03/22 1803  DME Walker rolling  Once       Question:  Patient needs a walker to treat with the following condition  Answer:  Total knee replacement status   06/03/22 1802   06/03/22 1803  DME Bedside commode  Once       Question:  Patient needs a bedside commode to treat with the following condition  Answer:  Total knee replacement status   06/03/22 1802            Disposition: Home with home health PT     Follow-up Information     Madelyn Flavors, PA-C Follow up on 06/18/2022.   Specialty: Orthopedic Surgery Why: at 8:45am Contact information: [address] [facility] [place]  [address] [phone]         Donato Heinz, MD Follow up on 07/16/2022.   Specialty: Orthopedic Surgery Why: at 3:00pm Contact information: [address] [facility] [place] [facility] [address] [facility] [place] [address] [phone]                  Lasandra Beech, PA-C 06/04/2022, 11:56 AM

## 2022-06-05 ENCOUNTER — Encounter: Payer: Self-pay | Admitting: Orthopedic Surgery

## 2022-07-23 ENCOUNTER — Emergency Department: Payer: Medicare PPO

## 2022-07-23 ENCOUNTER — Encounter: Payer: Self-pay | Admitting: Emergency Medicine

## 2022-07-23 ENCOUNTER — Emergency Department
Admission: EM | Admit: 2022-07-23 | Discharge: 2022-07-24 | Disposition: A | Discharge status: Home / Self Care | Payer: Medicare PPO | Attending: Emergency Medicine | Admitting: Emergency Medicine

## 2022-07-23 DIAGNOSIS — J45909 Unspecified asthma, uncomplicated: Secondary | ICD-10-CM | POA: Diagnosis not present

## 2022-07-23 DIAGNOSIS — W228XXA Striking against or struck by other objects, initial encounter: Secondary | ICD-10-CM | POA: Diagnosis not present

## 2022-07-23 DIAGNOSIS — H5704 Mydriasis: Secondary | ICD-10-CM | POA: Insufficient documentation

## 2022-07-23 DIAGNOSIS — I10 Essential (primary) hypertension: Secondary | ICD-10-CM | POA: Insufficient documentation

## 2022-07-23 DIAGNOSIS — S0511XA Contusion of eyeball and orbital tissues, right eye, initial encounter: Secondary | ICD-10-CM | POA: Diagnosis not present

## 2022-07-23 DIAGNOSIS — S0591XA Unspecified injury of right eye and orbit, initial encounter: Secondary | ICD-10-CM | POA: Diagnosis present

## 2022-07-23 LAB — CBC WITH DIFFERENTIAL/PLATELET
Abs Immature Granulocytes: 0.01 10*3/uL (ref 0.00–0.07)
Basophils Absolute: 0 10*3/uL (ref 0.0–0.1)
Basophils Relative: 1 %
Eosinophils Absolute: 0.2 10*3/uL (ref 0.0–0.5)
Eosinophils Relative: 4 %
HCT: 40.8 % (ref 39.0–52.0)
Hemoglobin: 13.1 g/dL (ref 13.0–17.0)
Immature Granulocytes: 0 %
Lymphocytes Relative: 20 %
Lymphs Abs: 0.9 10*3/uL (ref 0.7–4.0)
MCH: 29 pg (ref 26.0–34.0)
MCHC: 32.1 g/dL (ref 30.0–36.0)
MCV: 90.5 fL (ref 80.0–100.0)
Monocytes Absolute: 0.4 10*3/uL (ref 0.1–1.0)
Monocytes Relative: 10 %
Neutro Abs: 2.8 10*3/uL (ref 1.7–7.7)
Neutrophils Relative %: 65 %
Platelets: 257 10*3/uL (ref 150–400)
RBC: 4.51 MIL/uL (ref 4.22–5.81)
RDW: 12.9 % (ref 11.5–15.5)
WBC: 4.3 10*3/uL (ref 4.0–10.5)
nRBC: 0 % (ref 0.0–0.2)

## 2022-07-23 LAB — BASIC METABOLIC PANEL
Anion gap: 7 (ref 5–15)
BUN: 16 mg/dL (ref 8–23)
CO2: 26 mmol/L (ref 22–32)
Calcium: 9 mg/dL (ref 8.9–10.3)
Chloride: 109 mmol/L (ref 98–111)
Creatinine, Ser: 0.96 mg/dL (ref 0.61–1.24)
GFR, Estimated: >60 mL/min (ref >60)
Glucose, Bld: 108 mg/dL — ABNORMAL HIGH (ref 70–99)
Potassium: 4.5 mmol/L (ref 3.5–5.1)
Sodium: 142 mmol/L (ref 135–145)

## 2022-07-23 MED ORDER — FLUORESCEIN SODIUM 1 MG OP STRP
1.0000 | ORAL_STRIP | Freq: Once | OPHTHALMIC | Status: AC
  Administered 2022-07-23: 1 via OPHTHALMIC
  Filled 2022-07-23: qty 1

## 2022-07-23 MED ORDER — TETRACAINE HCL 0.5 % OP SOLN
2.0000 [drp] | Freq: Once | OPHTHALMIC | Status: AC
  Administered 2022-07-23: 2 [drp] via OPHTHALMIC
  Filled 2022-07-23: qty 4

## 2022-07-23 MED ORDER — IOHEXOL 300 MG/ML  SOLN
75.0000 mL | Freq: Once | INTRAMUSCULAR | Status: AC | PRN
  Administered 2022-07-23: 75 mL via INTRAVENOUS

## 2022-07-23 MED ORDER — PREDNISOLONE ACETATE 1 % OP SUSP
1.0000 [drp] | OPHTHALMIC | Status: AC
  Administered 2022-07-23: 1 [drp] via OPHTHALMIC
  Filled 2022-07-23: qty 5

## 2022-07-23 NOTE — ED Provider Notes (Signed)
[facility] Provider Note    None    (approximate)   History   Eye Pain   HPI  Donald Kennedy is a 66 y.o. male  with history of hypertension, asthma, BPH, and as in EMR presents to the ER after right eye injury. Around 6:30pm today, he was on his tractor and a dead tree limb caught on the cab and sprung forward, coming into direct contact with his right eye.  He immediately had blurred vision.  When he got back to the house, he looked in the mirror and noticed that his right pupil was large in comparison to the left.  No history of anisocoria.  He continues to experience blurred vision.  No eye pain.  No headache.      Physical Exam   Triage Vital Signs: ED Triage Vitals  Enc Vitals Group     BP 07/23/22 1930 (!) 156/84     Pulse Rate 07/23/22 1930 64     Resp 07/23/22 1930 18     Temp 07/23/22 1930 97.8 F (36.6 C)     Temp Source 07/23/22 1930 Oral     SpO2 07/23/22 1930 98 %     Weight 07/23/22 1929 228 lb (103.4 kg)     Height 07/23/22 1929 5\' 7"  (1.702 m)     Head Circumference --      Peak Flow --      Pain Score 07/23/22 1929 0     Pain Loc --      Pain Edu? --      Excl. in GC? --     Most recent vital signs: Vitals:   07/23/22 1930  BP: (!) 156/84  Pulse: 64  Resp: 18  Temp: 97.8 F (36.6 C)  SpO2: 98%     General: Awake, no distress.  CV:  Good peripheral perfusion.  Resp:  Normal effort.  Abd:  No distention.  Other:  Right pupil 5 mm, nonreactive to light.  Hyphema noted on magnified exam.  No pain on extraocular eye movement.  No drainage from the eye.  No corneal abrasion on fluorescein stain exam.  Intraocular pressure 19, 22, 20, 23   ED Results / Procedures / Treatments   Labs (all labs ordered are listed, but only abnormal results are displayed) Labs Reviewed  BASIC METABOLIC PANEL - Abnormal; Notable for the following components:      Result Value   Glucose, Bld 108 (*)    All other components within  normal limits  CBC WITH DIFFERENTIAL/PLATELET     EKG  Not indicated   RADIOLOGY CT with contrast of the orbits interpreted by me: No obvious globe rupture. Radiology report reviewed: No acute findings   PROCEDURES:  Critical Care performed: No  Procedures   MEDICATIONS ORDERED IN ED: Medications  tetracaine (PONTOCAINE) 0.5 % ophthalmic solution 2 drop (2 drops Right Eye Given by Other 07/23/22 2113)  fluorescein ophthalmic strip 1 strip (1 strip Right Eye Given 07/23/22 2113)  iohexol (OMNIPAQUE) 300 MG/ML solution 75 mL (75 mLs Intravenous Contrast Given 07/23/22 2222)  prednisoLONE acetate (PRED FORTE) 1 % ophthalmic suspension 1 drop (1 drop Right Eye Given 07/23/22 2259)     IMPRESSION / MDM / ASSESSMENT AND PLAN / ED COURSE  I reviewed the triage vital signs and the nursing notes.  Differential diagnosis includes, but is not limited to, traumatic hyphema, globe rupture, orbital hematoma  Patient's presentation is most consistent with acute presentation with potential threat to life or bodily function.  66 year old male presenting to the emergency department for treatment and evaluation of injury to the right eye.  See HPI for further details.  On initial exam, the right pupil is significantly larger in comparison to the left.  Pupil is nonreactive to light and there is a visual hyphema.  Plan will be to get baseline labs to evaluate kidney function then obtain a CT with contrast of the orbits to ensure no globe rupture or orbital hematoma.  Ophthalmology on-call to be paged by secretary.  Clinical Course as of 07/23/22 2340  Thu Jul 23, 2022  2145 Consulted with Dr. George Ina, ophthalmologist on-call, who will come to the emergency department and evaluate the patient. [CT]  2336 Dr. George Ina evaluated the patient and has provided plan of care to the patient and Donald Kennedy. Prednisolone acetate drop were obtained from hospital pharmacy and initial  drop given while here.  Eye patch applied. CT of the orbits with contrast is without acute concerns. Patient to be discharged home with instructions on use of drops, close follow-up with ophthalmology tomorrow, as well as ER return precautions. [CT]    Clinical Course User Index [CT] Anneli Bing B, FNP     FINAL CLINICAL IMPRESSION(S) / ED DIAGNOSES   Final diagnoses:  Traumatic mydriasis  Traumatic hyphema of right eye, initial encounter     Rx / DC Orders   ED Discharge Orders     None        Note:  This document was prepared using Dragon voice recognition software and may include unintentional dictation errors.   Victorino Dike, FNP 07/23/22 2340    Harvest Dark, MD 07/25/22 [id number]

## 2022-07-23 NOTE — ED Notes (Signed)
See triage note. Pt reports end of branch caught him in R eye; R eye currently swollen but not fully shut; pt has vision in eye but it is quite blurry per pt; white of pt's R eye red. Visitor at bedside with pt.

## 2022-07-23 NOTE — ED Notes (Addendum)
Visual acuity completed by eye doc per pt. Pt reports couldn't read even the largest letters with R eye. Eye shield applied and pt sent with extra silk, adhesive, and paper tape as options to hold in place. Pt given prednisolone acetate drops to continue at home as eye doc educated him to.

## 2022-07-23 NOTE — ED Notes (Signed)
Eye doc done assessing pt. Pt leaving for CT.

## 2022-07-23 NOTE — ED Triage Notes (Signed)
Pt presents via POV with complaints of right eye pain following an injury with a tree limb hitting his eye tonight. He endorses blurred vision and redness to his eye since the incident occurred. Denies double vision or bleeding.

## 2022-07-23 NOTE — Consult Note (Signed)
Subjective: Pt states that he was struck in the OD by a tree branch while on his tractor.  Can hardly see. Mild discomfort. OS normal.  Objective: Vital signs in last 24 hours: Temp:  [97.8 F (36.6 C)] 97.8 F (36.6 C) (09/21 1930) Pulse Rate:  [64] 64 (09/21 1930) Resp:  [18] 18 (09/21 1930) BP: (156)/(84) 156/84 (09/21 1930) SpO2:  [98 %] 98 % (09/21 1930) Weight:  [103.4 kg] 103.4 kg (09/21 1929)    Objective:  focused exam. NVA with readers. Unable OD.                                                  Vision is HM 2 feet OD                                                   Vision is 20/25 OS                                  Pupil is dilated due to trauma.                                        IOP is 20 OD / 18 OS                                  No APD.                                  Motility is normal. Slit lamp exam OD  there is 4 + suspended hyphema with early formation of layering hyphema. Cornea and sclera are intact. There appears to be iridodialysis inferiorly. Lens is intact and central.   There is no view at all to the post segment.  OS is normal.  CT orbits is pending. Low suspicion for open globe but discussed this possibility with pt and his wife.                                                        Recent Labs    07/23/22 2150  WBC 4.3  HGB 13.1  HCT 40.8  NA 142  K 4.5  CL 109  CO2 26  BUN 16  CREATININE 0.96    Studies/Results: No results found.  Medications: I have reviewed the patient's current medications.  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 Discussed. 2) Traumatic hyphema. Risk of re-bleed over the next 7-10 days discussed. Pt will need to minimize physical activity and sleep in a shield nightly for one week. Please provide shield and tape on discharge. 3) iridodialysis. Risk of IOP changes acutely and over time discussed. I will see the patient on a daily basis at the [facility] starting tomorrow.  My  card was  given. Pt instructed to call in the am to get pm appointment with me. 4) Pt will need a "B scan" ultrasound in my office to further assess the post. segment in follow up.   Combigan gtts given. Pt is to apply 1 gtt OD bid.  Please d/c pt with prednisolone acetate 1% to be applied 1 gtt 4 times daily OD beginning NOW.     LOS: 0 days   Birder Robson 9/21/202310:22 PM

## 2022-07-23 NOTE — ED Notes (Signed)
Pt and visitor to eye room in triage 8. Room temp inc at pt's request.

## 2022-07-23 NOTE — Discharge Instructions (Addendum)
Please call [facility] tomorrow to request an appointment.  NO strenuous activities until cleared by the specialist.  Use 1 drop of the prednisolone acetate every 8 hours and 1 drop of the Combigan 2 times per day.  Return to the ER for changes of concern if unable to see ophthalmology.

## 2022-07-24 NOTE — ED Notes (Signed)
E signature pad not working. Pt educated on discharge instructions and verbalized understanding.  

## 2022-08-02 ENCOUNTER — Other Ambulatory Visit: Payer: Self-pay | Admitting: Urology

## 2022-11-20 ENCOUNTER — Encounter: Payer: Self-pay | Admitting: Urology

## 2023-03-17 ENCOUNTER — Ambulatory Visit: Payer: Medicare PPO | Admitting: Urology

## 2023-03-18 ENCOUNTER — Ambulatory Visit: Payer: Medicare PPO | Admitting: Urology

## 2023-03-18 ENCOUNTER — Encounter: Payer: Self-pay | Admitting: Urology

## 2023-03-18 VITALS — BP 149/74 | HR 66 | Ht 67.0 in | Wt 245.0 lb

## 2023-03-18 DIAGNOSIS — Z87440 Personal history of urinary (tract) infections: Secondary | ICD-10-CM | POA: Diagnosis not present

## 2023-03-18 DIAGNOSIS — N138 Other obstructive and reflux uropathy: Secondary | ICD-10-CM

## 2023-03-18 DIAGNOSIS — N401 Enlarged prostate with lower urinary tract symptoms: Secondary | ICD-10-CM

## 2023-03-18 DIAGNOSIS — Z125 Encounter for screening for malignant neoplasm of prostate: Secondary | ICD-10-CM

## 2023-03-18 DIAGNOSIS — N390 Urinary tract infection, site not specified: Secondary | ICD-10-CM

## 2023-03-18 LAB — BLADDER SCAN AMB NON-IMAGING

## 2023-03-18 MED ORDER — TADALAFIL 10 MG PO TABS: 10.0000 mg | ORAL_TABLET | Freq: Every day | ORAL | 11 refills | Status: AC | PRN

## 2023-03-18 MED ORDER — TAMSULOSIN HCL 0.4 MG PO CAPS: ORAL_CAPSULE | ORAL | 3 refills | Status: DC

## 2023-03-18 NOTE — Progress Notes (Signed)
   03/18/2023 1:50 PM   Donald Kennedy 02-Aug-1956 [id number]  Reason for visit: Follow up recurrent UTIs, BPH, ED, PSA screening  HPI: 67 year old male who had recurrent E. coli prostatitis in 2021, and right-sided epididymitis.  He underwent further evaluation with a CT urogram in February 2022 that was benign and showed a 47 g prostate, and cystoscopy showed a moderate size prostate with some obstructing lateral lobes of the bladder was grossly normal.  He opted to continue Flomax.  PVRs have been normal.  PVR is normal again today at 26 mL.  He has been doing very well on the Flomax and really denies significant urinary symptoms today aside from some occasional urgency.  He denies any UTIs, gross hematuria, or dysuria over the last year.  He has a history of a slightly abnormal scrotal ultrasound with some heterogeneity of the epididymis.  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  He denies any scrotal pain.  In terms of his ED, he really has not had to use the Cialis, and is doing well.  PSA is normal at 0.81 from April 2024.  Can continue screening every other year with PCP through age 34  Flomax and Cialis refilled Can follow-up with PCP to fill above in the future, we are happy to see him back if further urologic issues arise   Sondra Come, MD  [facility] [address] [place], [place] [address] [phone]

## 2023-05-11 IMAGING — DX DG KNEE 1-2V PORT*R*
2 series · 2 of 2 positions shown · non-contrast
Comparison: None.

CLINICAL DATA: Status post right total knee replacement.

EXAM:
PORTABLE RIGHT KNEE - 1-2 VIEW

[knee ap]
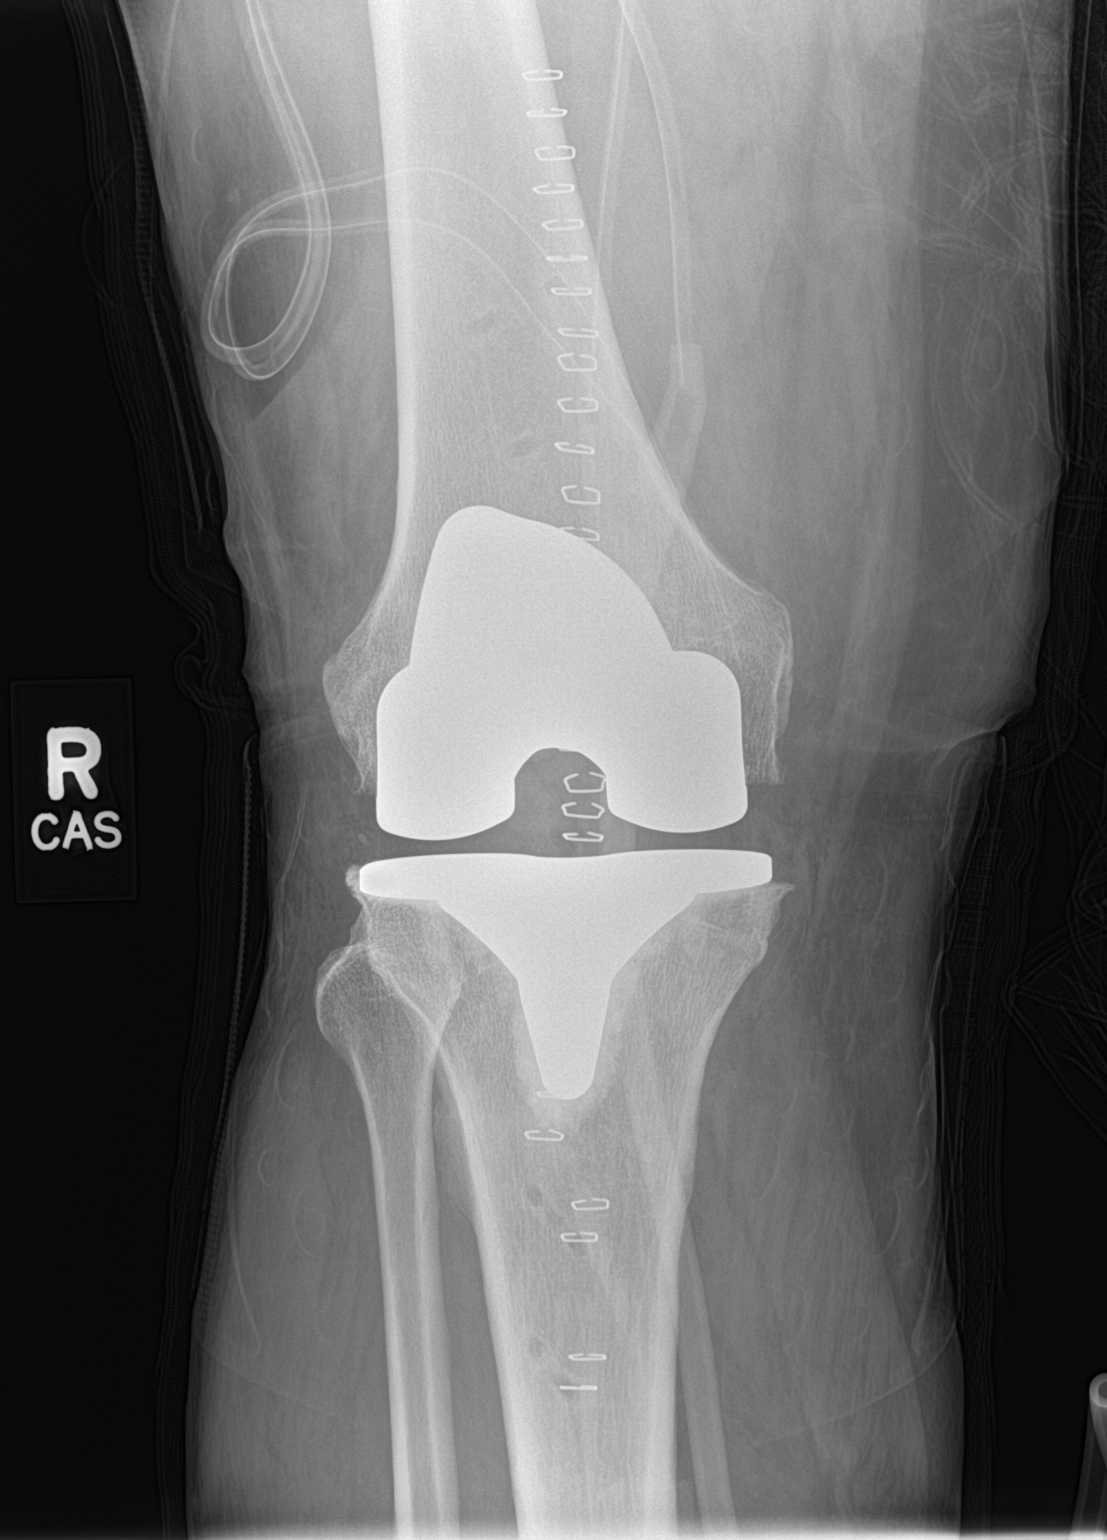

[knee lat]
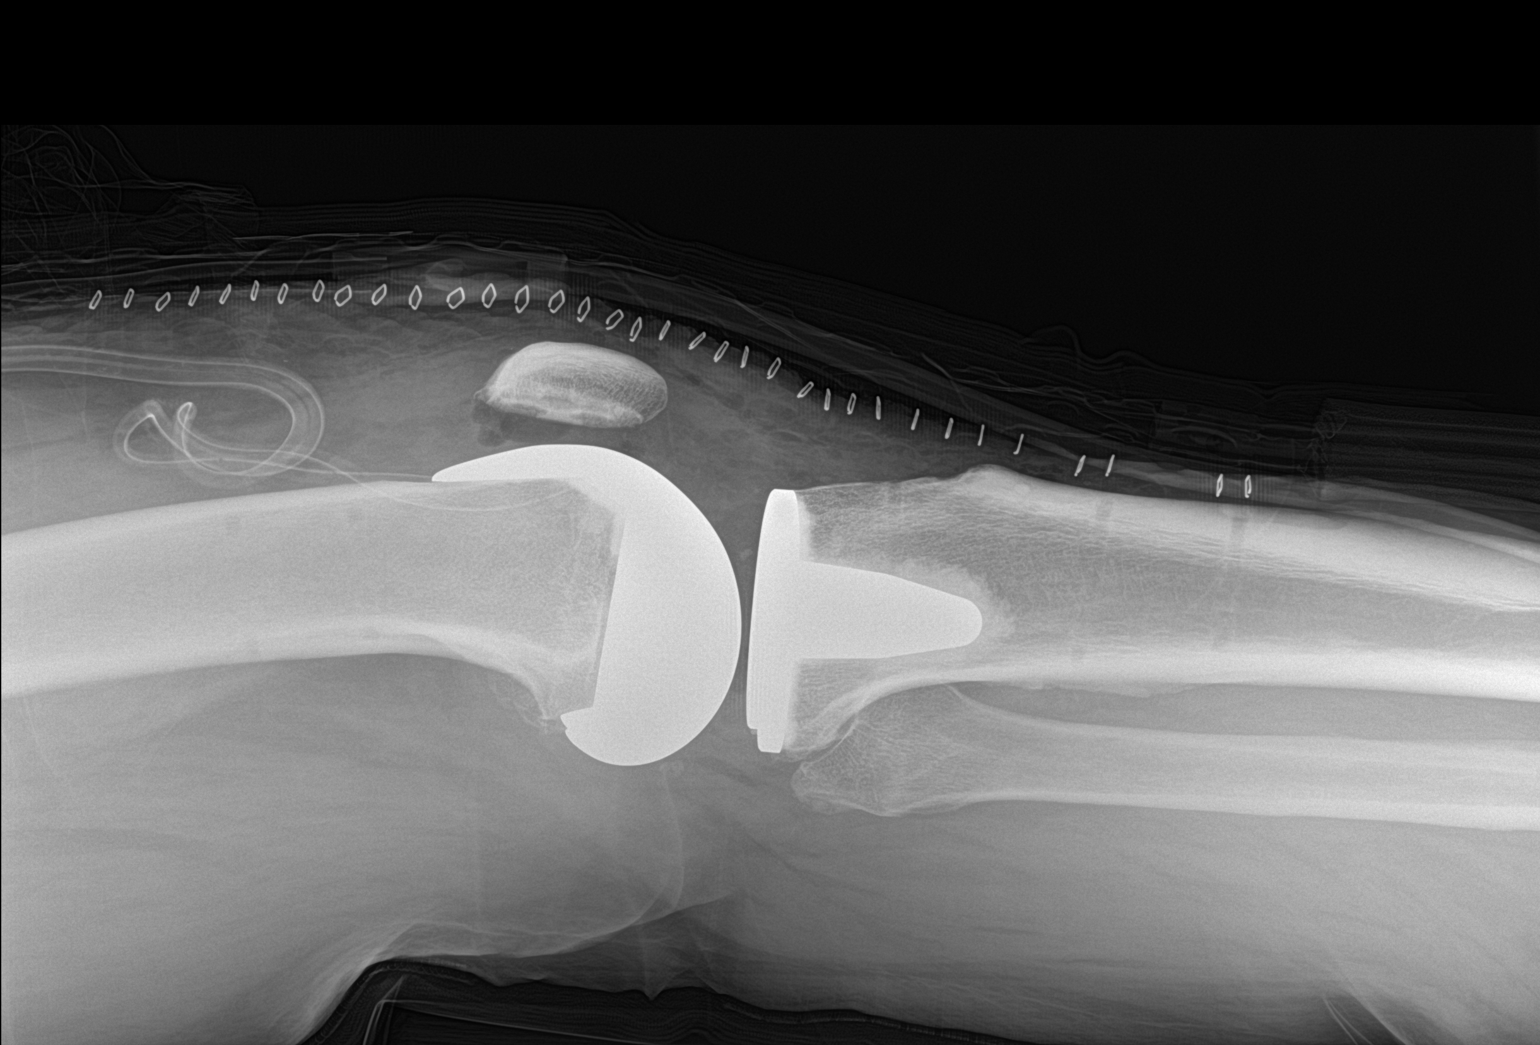

[2 of 2 positions shown; findings below may reference images not displayed]

FINDINGS: Right total knee arthroplasty. Knee is located without a
periprosthetic fracture. Surgical drain is present. Skin staples are
noted. Expected soft tissue changes.
IMPRESSION: Right knee arthroplasty with expected postoperative changes.

## 2023-05-17 ENCOUNTER — Other Ambulatory Visit: Payer: Self-pay | Admitting: Urology

## 2023-05-17 DIAGNOSIS — N138 Other obstructive and reflux uropathy: Secondary | ICD-10-CM

## 2023-11-15 ENCOUNTER — Telehealth: Payer: Self-pay | Admitting: Urology

## 2023-11-15 NOTE — Telephone Encounter (Signed)
 Called pt informed him of the need to use [employer]. Pt voiced understanding.

## 2023-11-15 NOTE — Telephone Encounter (Signed)
 Patient called to and said when he went to pharmacy to pick up his Tadalafill, it was going to cost him $1300 because his insurance will not cover it. Is there something else he can get?

## 2024-04-07 ENCOUNTER — Encounter: Payer: Self-pay | Admitting: Ophthalmology

## 2024-04-14 ENCOUNTER — Other Ambulatory Visit: Payer: Self-pay | Admitting: Family Medicine

## 2024-04-14 DIAGNOSIS — M5416 Radiculopathy, lumbar region: Secondary | ICD-10-CM

## 2024-04-14 NOTE — Discharge Instructions (Signed)

## 2024-04-18 ENCOUNTER — Encounter: Payer: Self-pay | Admitting: Ophthalmology

## 2024-04-18 ENCOUNTER — Ambulatory Visit: Payer: Self-pay | Admitting: Anesthesiology

## 2024-04-18 ENCOUNTER — Ambulatory Visit
Admission: RE | Admit: 2024-04-18 | Discharge: 2024-04-18 | Disposition: A | Discharge status: Home / Self Care | Attending: Ophthalmology | Admitting: Ophthalmology

## 2024-04-18 ENCOUNTER — Other Ambulatory Visit: Payer: Self-pay

## 2024-04-18 ENCOUNTER — Encounter
Admission: RE | Disposition: A | Discharge status: Home / Self Care | Payer: Self-pay | Source: Home / Self Care | Attending: Ophthalmology

## 2024-04-18 DIAGNOSIS — M1990 Unspecified osteoarthritis, unspecified site: Secondary | ICD-10-CM | POA: Diagnosis not present

## 2024-04-18 DIAGNOSIS — F32A Depression, unspecified: Secondary | ICD-10-CM | POA: Insufficient documentation

## 2024-04-18 DIAGNOSIS — H2511 Age-related nuclear cataract, right eye: Secondary | ICD-10-CM | POA: Diagnosis present

## 2024-04-18 DIAGNOSIS — Z791 Long term (current) use of non-steroidal anti-inflammatories (NSAID): Secondary | ICD-10-CM | POA: Diagnosis not present

## 2024-04-18 DIAGNOSIS — Z79899 Other long term (current) drug therapy: Secondary | ICD-10-CM | POA: Diagnosis not present

## 2024-04-18 DIAGNOSIS — I10 Essential (primary) hypertension: Secondary | ICD-10-CM | POA: Diagnosis not present

## 2024-04-18 HISTORY — PX: CATARACT EXTRACTION W/PHACO: SHX586

## 2024-04-18 HISTORY — DX: Allergy, unspecified, initial encounter: T78.40XA

## 2024-04-18 SURGERY — PHACOEMULSIFICATION, CATARACT, WITH IOL INSERTION
Anesthesia: Monitor Anesthesia Care | Laterality: Right

## 2024-04-18 MED ORDER — EPINEPHRINE PF 1 MG/ML IJ SOLN
INTRAMUSCULAR | Status: DC | PRN
  Administered 2024-04-18: 95 mL via OPHTHALMIC

## 2024-04-18 MED ORDER — LIDOCAINE HCL (PF) 2 % IJ SOLN
INTRAOCULAR | Status: DC | PRN
  Administered 2024-04-18: 1 mL

## 2024-04-18 MED ORDER — FENTANYL CITRATE (PF) 100 MCG/2ML IJ SOLN
INTRAMUSCULAR | Status: DC | PRN
  Administered 2024-04-18 (×2): 50 ug via INTRAVENOUS

## 2024-04-18 MED ORDER — ARMC OPHTHALMIC DILATING DROPS
1.0000 | OPHTHALMIC | Status: DC | PRN
  Administered 2024-04-18 (×3): 1 via OPHTHALMIC

## 2024-04-18 MED ORDER — FENTANYL CITRATE (PF) 100 MCG/2ML IJ SOLN
INTRAMUSCULAR | Status: AC
Start: 2024-04-18 — End: 2024-04-18
  Filled 2024-04-18: qty 2

## 2024-04-18 MED ORDER — BRIMONIDINE TARTRATE-TIMOLOL 0.2-0.5 % OP SOLN
OPHTHALMIC | Status: DC | PRN
  Administered 2024-04-18: 1 [drp] via OPHTHALMIC

## 2024-04-18 MED ORDER — TETRACAINE HCL 0.5 % OP SOLN
OPHTHALMIC | Status: AC
  Filled 2024-04-18: qty 4

## 2024-04-18 MED ORDER — SIGHTPATH DOSE#1 NA CHONDROIT SULF-NA HYALURON 40-17 MG/ML IO SOLN
INTRAOCULAR | Status: DC | PRN
  Administered 2024-04-18: 1 mL via INTRAOCULAR

## 2024-04-18 MED ORDER — TETRACAINE HCL 0.5 % OP SOLN
1.0000 [drp] | OPHTHALMIC | Status: DC | PRN
  Administered 2024-04-18 (×3): 1 [drp] via OPHTHALMIC

## 2024-04-18 MED ORDER — MIDAZOLAM HCL 2 MG/2ML IJ SOLN
INTRAMUSCULAR | Status: DC | PRN
  Administered 2024-04-18 (×2): 1 mg via INTRAVENOUS

## 2024-04-18 MED ORDER — CARBACHOL 0.01 % IO SOLN
INTRAOCULAR | Status: DC | PRN
  Administered 2024-04-18: .5 mL via INTRAOCULAR

## 2024-04-18 MED ORDER — MOXIFLOXACIN HCL 0.5 % OP SOLN
OPHTHALMIC | Status: DC | PRN
Start: 2024-04-18 — End: 2024-04-18
  Administered 2024-04-18: .2 mL via OPHTHALMIC

## 2024-04-18 MED ORDER — SIGHTPATH DOSE#1 BSS IO SOLN
INTRAOCULAR | Status: DC | PRN
  Administered 2024-04-18 (×2): 15 mL via INTRAOCULAR

## 2024-04-18 MED ORDER — MIDAZOLAM HCL 2 MG/2ML IJ SOLN
INTRAMUSCULAR | Status: AC
  Filled 2024-04-18: qty 2

## 2024-04-18 MED ORDER — ARMC OPHTHALMIC DILATING DROPS
OPHTHALMIC | Status: AC
  Filled 2024-04-18: qty 0.5

## 2024-04-18 SURGICAL SUPPLY — 12 items
CATARACT SUITE SIGHTPATH (MISCELLANEOUS) ×1 IMPLANT
CYSTOTOME ANGL RVRS SHRT 25G (CUTTER) ×2 IMPLANT
CYSTOTOME ANGL RVRS SHRT 25GA (CUTTER) ×1 IMPLANT
FEE CATARACT SUITE SIGHTPATH (MISCELLANEOUS) ×2 IMPLANT
GLOVE BIOGEL PI IND STRL 8 (GLOVE) ×2 IMPLANT
GLOVE SURG LX STRL 8.0 MICRO (GLOVE) ×2 IMPLANT
GLOVE SURG PROTEXIS BL SZ6.5 (GLOVE) ×1 IMPLANT
GLOVE SURG SYN 6.5 PF PI BL (GLOVE) ×2 IMPLANT
LENS IOL TECNIS EYHANCE 24.0 (Intraocular Lens) IMPLANT
NDL FILTER BLUNT 18X1 1/2 (NEEDLE) ×2 IMPLANT
NEEDLE FILTER BLUNT 18X1 1/2 (NEEDLE) ×1 IMPLANT
SYR 3ML LL SCALE MARK (SYRINGE) ×2 IMPLANT

## 2024-04-18 NOTE — Op Note (Signed)
 PREOPERATIVE DIAGNOSIS:  Nuclear sclerotic cataract of the right eye.   POSTOPERATIVE DIAGNOSIS:  Right Eye Cataract   OPERATIVE PROCEDURE:ORPROCALL@   SURGEON:  Clair Crews, MD.   ANESTHESIA:  Anesthesiologist: Nancey Awkward, MD CRNA: Wilhelmena Hanson, CRNA  1.      Managed anesthesia care. 2.      0.46ml of Shugarcaine was instilled in the eye following the paracentesis.   COMPLICATIONS:  None.   TECHNIQUE:   Stop and chop   DESCRIPTION OF PROCEDURE:  The patient was examined and consented in the preoperative holding area where the aforementioned topical anesthesia was applied to the right eye and then brought back to the Operating Room where the right eye was prepped and draped in the usual sterile ophthalmic fashion and a lid speculum was placed. A paracentesis was created with the side port blade and the anterior chamber was filled with viscoelastic. A near clear corneal incision was performed with the steel keratome. A continuous curvilinear capsulorrhexis was performed with a cystotome followed by the capsulorrhexis forceps. Hydrodissection and hydrodelineation were carried out with BSS on a blunt cannula. The lens was removed in a stop and chop  technique and the remaining cortical material was removed with the irrigation-aspiration handpiece. The capsular bag was inflated with viscoelastic and the Technis [id number]  lens was placed in the capsular bag without complication. The remaining viscoelastic was removed from the eye with the irrigation-aspiration handpiece. The wounds were hydrated. The anterior chamber was flushed with BSS and the eye was inflated to physiologic pressure. 0.1ml of Vigamox was placed in the anterior chamber. The wounds were found to be water tight. The eye was dressed with Combigan. The patient was given protective glasses to wear throughout the day and a shield with which to sleep tonight. The patient was also given drops with which to begin a drop regimen today  and will follow-up with me in one day. Implant Name Type Inv. Item Serial No. Manufacturer Lot No. LRB No. Used Action  LENS IOL TECNIS EYHANCE 24.0 - [id number] Intraocular Lens LENS IOL TECNIS EYHANCE 24.0 [id number] [employer]  Right 1 Implanted   Procedure(s): PHACOEMULSIFICATION, CATARACT, WITH IOL INSERTION (Right)  Electronically signed: Clair Crews 04/18/2024 12:43 PM

## 2024-04-18 NOTE — Anesthesia Preprocedure Evaluation (Signed)
 Anesthesia Evaluation  Patient identified by MRN, date of birth, ID band Patient awake    Reviewed: Allergy & Precautions, NPO status , Patient's Chart, lab work & pertinent test results  History of Anesthesia Complications Negative for: history of anesthetic complications  Airway Mallampati: III  TM Distance: >3 FB Neck ROM: full    Dental   Pulmonary neg pulmonary ROS   Pulmonary exam normal        Cardiovascular hypertension, negative cardio ROS Normal cardiovascular exam     Neuro/Psych  PSYCHIATRIC DISORDERS  Depression     Neuromuscular disease    GI/Hepatic negative GI ROS, Neg liver ROS,,,  Endo/Other  negative endocrine ROS    Renal/GU negative Renal ROS  negative genitourinary   Musculoskeletal  (+) Arthritis ,    Abdominal   Peds  Hematology negative hematology ROS (+)   Anesthesia Other Findings Past Medical History: No date: Allergies No date: Asthma     Comment:  as a child No date: BPH (benign prostatic hyperplasia) No date: Carpal tunnel syndrome     Comment:  right No date: Hyperlipidemia 2019: Hypertension     Comment:  only took medication for a short time; no longer on               medication (wants removed from record) No date: Osteoarthritis  Past Surgical History: No date: CARPAL TUNNEL RELEASE; Right     Comment:  1980's No date: COLONOSCOPY     Comment:  2013, 2016, 2022 No date: EYE SURGERY 01/19/2022: KNEE ARTHROPLASTY; Right     Comment:  Procedure: COMPUTER ASSISTED TOTAL KNEE ARTHROPLASTY;                Surgeon: Arlyne Lame, MD;  Location: [facility];                Service: Orthopedics;  Laterality: Right; 06/03/2022: KNEE ARTHROPLASTY; Left     Comment:  Procedure: COMPUTER ASSISTED TOTAL KNEE ARTHROPLASTY;                Surgeon: Arlyne Lame, MD;  Location: [facility];                Service: Orthopedics;  Laterality: Left; 11/26/2021: KNEE ARTHROSCOPY; Right      Comment:  Procedure: ARTHROSCOPY KNEE; MENISCECTOMY;               CHONDROPLASTY;  Surgeon: Arlyne Lame, MD;  Location:               [facility];  Service: Orthopedics;  Laterality: Right; No date: NASAL SINUS SURGERY No date: REFRACTIVE SURGERY; Bilateral     Comment:  Lasik 1960: TONSILLECTOMY  BMI    Body Mass Index: 37.59 kg/m      Reproductive/Obstetrics negative OB ROS                              Anesthesia Physical Anesthesia Plan  ASA: 2  Anesthesia Plan: MAC   Post-op Pain Management: Minimal or no pain anticipated   Induction: Intravenous  PONV Risk Score and Plan:   Airway Management Planned: Natural Airway and Nasal Cannula  Additional Equipment:   Intra-op Plan:   Post-operative Plan:   Informed Consent: I have reviewed the patients History and Physical, chart, labs and discussed the procedure including the risks, benefits and alternatives for the proposed anesthesia with the patient or authorized representative who has indicated  his/her understanding and acceptance.     Dental Advisory Given  Plan Discussed with: Anesthesiologist, CRNA and Surgeon  Anesthesia Plan Comments: (Patient consented for risks of anesthesia including but not limited to:  - adverse reactions to medications - damage to eyes, teeth, lips or other oral mucosa - nerve damage due to positioning  - sore throat or hoarseness - Damage to heart, brain, nerves, lungs, other parts of body or loss of life  Patient voiced understanding and assent.)         Anesthesia Quick Evaluation

## 2024-04-18 NOTE — Anesthesia Postprocedure Evaluation (Signed)
 Anesthesia Post Note  Patient: Donald Kennedy  Procedure(s) Performed: PHACOEMULSIFICATION, CATARACT, WITH IOL INSERTION 18.44, 02:20.5 (Right)  Patient location during evaluation: PACU Anesthesia Type: MAC Level of consciousness: awake and alert Pain management: pain level controlled Vital Signs Assessment: post-procedure vital signs reviewed and stable Respiratory status: spontaneous breathing, nonlabored ventilation, respiratory function stable and patient connected to nasal cannula oxygen Cardiovascular status: stable and blood pressure returned to baseline Postop Assessment: no apparent nausea or vomiting Anesthetic complications: no   No notable events documented.   Last Vitals:  Vitals:   04/18/24 1244 04/18/24 1249  BP: 133/71 133/86  Pulse: (!) 58 (!) 57  Resp: 14 12  Temp: 36.4 C 36.4 C  SpO2: 95% 94%    Last Pain:  Vitals:   04/18/24 1244  TempSrc:   PainSc: 0-No pain                 Nancey Awkward

## 2024-04-18 NOTE — H&P (Signed)
 [facility]   Primary Care Physician:  Antonio Baumgarten, MD Ophthalmologist: Dr. Merrell Abate  Pre-Procedure History & Physical: HPI:  Donald Kennedy is a 68 y.o. male here for cataract surgery.   Past Medical History:  Diagnosis Date   Allergies    Asthma    as a child   BPH (benign prostatic hyperplasia)    Carpal tunnel syndrome    right   Hyperlipidemia    Hypertension 2019   only took medication for a short time; no longer on medication (wants removed from record)   Osteoarthritis     Past Surgical History:  Procedure Laterality Date   CARPAL TUNNEL RELEASE Right    1980's   COLONOSCOPY     2013, 2016, 2022   EYE SURGERY     KNEE ARTHROPLASTY Right 01/19/2022   Procedure: COMPUTER ASSISTED TOTAL KNEE ARTHROPLASTY;  Surgeon: Arlyne Lame, MD;  Location: [facility];  Service: Orthopedics;  Laterality: Right;   KNEE ARTHROPLASTY Left 06/03/2022   Procedure: COMPUTER ASSISTED TOTAL KNEE ARTHROPLASTY;  Surgeon: Arlyne Lame, MD;  Location: [facility];  Service: Orthopedics;  Laterality: Left;   KNEE ARTHROSCOPY Right 11/26/2021   Procedure: ARTHROSCOPY KNEE; MENISCECTOMY; CHONDROPLASTY;  Surgeon: Arlyne Lame, MD;  Location: [facility];  Service: Orthopedics;  Laterality: Right;   NASAL SINUS SURGERY     REFRACTIVE SURGERY Bilateral    Lasik   TONSILLECTOMY  1960    Prior to Admission medications   Medication Sig Start Date End Date Taking? Authorizing Provider  carboxymethylcellul-glycerin (REFRESH OPTIVE) 0.5-0.9 % ophthalmic solution Apply 1-2 drops to eye as needed for dry eyes.   Yes [provider]  cetirizine (ZYRTEC) 10 MG tablet Take 10 mg by mouth at bedtime.   Yes [provider]  clobetasol  cream (TEMOVATE ) 0.05 % Apply 1 application. topically 2 (two) times daily as needed. 01/11/22  Yes [provider]  cyanocobalamin  1000 MCG tablet Take 1,000 mcg by mouth daily.   Yes [provider]  simvastatin  (ZOCOR ) 40 MG  tablet Take 40 mg by mouth at bedtime. 10/21/20  Yes [provider]  tamsulosin  (FLOMAX ) 0.4 MG CAPS capsule TAKE 1 CAPSULE(0.4 MG) BY MOUTH DAILY 03/18/23  Yes Lawerence Pressman, MD  celecoxib  (CELEBREX ) 200 MG capsule Take 1 capsule (200 mg total) by mouth 2 (two) times daily. Patient not taking: Reported on 04/07/2024 06/04/22   Adrien Horner, PA-C  Propylene Glycol (SYSTANE COMPLETE) 0.6 % SOLN Place 1 drop into both eyes daily as needed (dry eyes).    [provider]  tadalafil  (CIALIS ) 10 MG tablet Take 1 tablet (10 mg total) by mouth daily as needed for erectile dysfunction. 03/18/23   Lawerence Pressman, MD  traMADol  (ULTRAM ) 50 MG tablet Take 1 tablet (50 mg total) by mouth every 4 (four) hours as needed for moderate pain. Patient not taking: Reported on 04/07/2024 06/04/22   Elridge Haller B, PA-C  trolamine salicylate (ASPERCREME) 10 % cream Apply 1 application topically as needed for muscle pain. Patient not taking: Reported on 04/07/2024    [provider]    Allergies as of 04/07/2024   (No Known Allergies)    Family History  Problem Relation Age of Onset   CAD Father    Cancer Father     Social History   Socioeconomic History   Marital status: Married    Spouse name: Lynnie Saucier   Number of children: 1   Years of education: Not on  file   Highest education level: Not on file  Occupational History   Not on file  Tobacco Use   Smoking status: Never    Passive exposure: Never   Smokeless tobacco: Never  Vaping Use   Vaping status: Never Used  Substance and Sexual Activity   Alcohol  use: Not Currently   Drug use: Never   Sexual activity: Yes    Birth control/protection: None  Other Topics Concern   Not on file  Social History Narrative   Not on file   Social Drivers of Health   Financial Resource Strain: Low Risk  (04/14/2024)   Received from [facility]   Overall Financial Resource Strain (CARDIA)    Difficulty of Paying  Living Expenses: Not hard at all  Food Insecurity: No Food Insecurity (04/14/2024)   Received from [facility]   Hunger Vital Sign    Within the past 12 months, you worried that your food would run out before you got the money to buy more.: Never true    Within the past 12 months, the food you bought just didn't last and you didn't have money to get more.: Never true  Transportation Needs: No Transportation Needs (04/14/2024)   Received from [facility] - Transportation    In the past 12 months, has lack of transportation kept you from medical appointments or from getting medications?: No    Lack of Transportation (Non-Medical): No  Physical Activity: Not on file  Stress: Not on file  Social Connections: Not on file  Intimate Partner Violence: Not on file    Review of Systems: See HPI, otherwise negative ROS  Physical Exam: BP (!) 142/80   Pulse 60   Temp 98 F (36.7 C) (Temporal)   Resp 18   Ht 5' 7 (1.702 m)   Wt 110.7 kg   SpO2 95%   BMI 38.22 kg/m  General:   Alert, cooperative. Head:  Normocephalic and atraumatic. Respiratory:  Normal work of breathing. Cardiovascular:  NAD  Impression/Plan: Donald Kennedy is here for cataract surgery.  Risks, benefits, limitations, and alternatives regarding cataract surgery have been reviewed with the patient.  Questions have been answered.  All parties agreeable.   Clair Crews, MD  04/18/2024, 12:02 PM

## 2024-04-18 NOTE — Transfer of Care (Signed)
 Immediate Anesthesia Transfer of Care Note  Patient: Donald Kennedy  Procedure(s) Performed: PHACOEMULSIFICATION, CATARACT, WITH IOL INSERTION (Right)  Patient Location: PACU  Anesthesia Type: MAC  Level of Consciousness: awake, alert  and patient cooperative  Airway and Oxygen Therapy: Patient Spontanous Breathing and Patient connected to supplemental oxygen  Post-op Assessment: Post-op Vital signs reviewed, Patient's Cardiovascular Status Stable, Respiratory Function Stable, Patent Airway and No signs of Nausea or vomiting  Post-op Vital Signs: Reviewed and stable  Complications: No notable events documented.

## 2024-04-19 ENCOUNTER — Encounter: Payer: Self-pay | Admitting: Ophthalmology

## 2024-04-19 ENCOUNTER — Ambulatory Visit
Admission: RE | Admit: 2024-04-19 | Discharge: 2024-04-19 | Disposition: A | Discharge status: Home / Self Care | Source: Ambulatory Visit | Attending: Family Medicine | Admitting: Family Medicine

## 2024-04-19 DIAGNOSIS — M5416 Radiculopathy, lumbar region: Secondary | ICD-10-CM

## 2024-05-27 ENCOUNTER — Other Ambulatory Visit: Payer: Self-pay | Admitting: Urology

## 2024-05-27 DIAGNOSIS — N138 Other obstructive and reflux uropathy: Secondary | ICD-10-CM

## 2024-05-31 ENCOUNTER — Other Ambulatory Visit: Payer: Self-pay

## 2024-05-31 DIAGNOSIS — N138 Other obstructive and reflux uropathy: Secondary | ICD-10-CM

## 2024-05-31 MED ORDER — TAMSULOSIN HCL 0.4 MG PO CAPS: ORAL_CAPSULE | ORAL | 0 refills | Status: AC
